# Patient Record
Sex: Female | Born: 1985 | Race: Black or African American | Hispanic: No | Smoking: Former smoker
Health system: Southern US, Community
[De-identification: ages and names within clinical notes are randomized; demographics above are authoritative.]

## PROBLEM LIST (undated history)

## (undated) DIAGNOSIS — G510 Bell's palsy: Secondary | ICD-10-CM

## (undated) HISTORY — PX: WISDOM TOOTH EXTRACTION: SHX21

## (undated) HISTORY — PX: TUBAL LIGATION: SHX77

## (undated) HISTORY — DX: Bell's palsy: G51.0

---
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
## 2008-02-06 ENCOUNTER — Inpatient Hospital Stay (HOSPITAL_COMMUNITY): Admission: AD | Admit: 2008-02-06 | Discharge: 2008-02-06 | Payer: Self-pay | Admitting: Obstetrics and Gynecology

## 2008-06-10 ENCOUNTER — Inpatient Hospital Stay (HOSPITAL_COMMUNITY): Admission: AD | Admit: 2008-06-10 | Discharge: 2008-06-11 | Payer: Self-pay | Admitting: Obstetrics and Gynecology

## 2008-07-11 ENCOUNTER — Inpatient Hospital Stay (HOSPITAL_COMMUNITY): Admission: AD | Admit: 2008-07-11 | Discharge: 2008-07-11 | Payer: Self-pay | Admitting: Obstetrics and Gynecology

## 2008-07-24 ENCOUNTER — Inpatient Hospital Stay (HOSPITAL_COMMUNITY): Admission: AD | Admit: 2008-07-24 | Discharge: 2008-07-28 | Payer: Self-pay | Admitting: Obstetrics and Gynecology

## 2008-07-25 ENCOUNTER — Encounter (INDEPENDENT_AMBULATORY_CARE_PROVIDER_SITE_OTHER): Payer: Self-pay | Admitting: Obstetrics and Gynecology

## 2010-07-20 ENCOUNTER — Encounter (INDEPENDENT_AMBULATORY_CARE_PROVIDER_SITE_OTHER): Payer: Self-pay | Admitting: Obstetrics and Gynecology

## 2010-07-20 ENCOUNTER — Inpatient Hospital Stay (HOSPITAL_COMMUNITY): Admission: RE | Admit: 2010-07-20 | Discharge: 2010-07-22 | Payer: Self-pay | Admitting: Obstetrics and Gynecology

## 2011-01-13 LAB — SURGICAL PCR SCREEN
MRSA, PCR: NEGATIVE
Staphylococcus aureus: POSITIVE — AB

## 2011-01-13 LAB — CBC
HCT: 30.2 % — ABNORMAL LOW (ref 36.0–46.0)
Hemoglobin: 10.5 g/dL — ABNORMAL LOW (ref 12.0–15.0)
MCH: 31.5 pg (ref 26.0–34.0)
MCHC: 34.7 g/dL (ref 30.0–36.0)
MCHC: 34.9 g/dL (ref 30.0–36.0)
MCV: 90.9 fL (ref 78.0–100.0)
MCV: 90.5 fL (ref 78.0–100.0)
Platelets: 202 10*3/uL (ref 150–400)
Platelets: 258 10*3/uL (ref 150–400)
RBC: 3.32 MIL/uL — ABNORMAL LOW (ref 3.87–5.11)
RDW: 13.8 % (ref 11.5–15.5)
RDW: 13.7 % (ref 11.5–15.5)
WBC: 9.9 10*3/uL (ref 4.0–10.5)
WBC: 8.9 10*3/uL (ref 4.0–10.5)

## 2011-01-13 LAB — URINALYSIS, ROUTINE W REFLEX MICROSCOPIC
Bilirubin Urine: NEGATIVE
Nitrite: NEGATIVE
Specific Gravity, Urine: 1.025 (ref 1.005–1.030)
pH: 6.5 (ref 5.0–8.0)

## 2011-01-13 LAB — RPR: RPR Ser Ql: NONREACTIVE

## 2011-01-13 LAB — URINE MICROSCOPIC-ADD ON

## 2011-01-15 ENCOUNTER — Inpatient Hospital Stay (INDEPENDENT_AMBULATORY_CARE_PROVIDER_SITE_OTHER)
Admission: RE | Admit: 2011-01-15 | Discharge: 2011-01-15 | Disposition: A | Discharge status: Home / Self Care | Payer: Commercial Managed Care - PPO | Source: Ambulatory Visit | Attending: Family Medicine | Admitting: Family Medicine

## 2011-01-15 DIAGNOSIS — J40 Bronchitis, not specified as acute or chronic: Secondary | ICD-10-CM
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
## 2011-03-15 NOTE — Op Note (Signed)
[name], [name] Reeves              ACCOUNT NO.:  [id number]   MEDICAL RECORD NO.:  [id number]          PATIENT TYPE:  INP   LOCATION:                                [facility]  [facility]   PHYSICIAN:  Janine Limbo, M.D.DATE OF BIRTH:  [birth year]   DATE OF PROCEDURE:  07/25/2008  DATE OF DISCHARGE:                               OPERATIVE REPORT   PREOPERATIVE DIAGNOSES:  1. Term intrauterine gestation.  2. Failure to progress and labor.  3. Nonreassuring fetal heart rate tracing.   POSTOPERATIVE DIAGNOSES:  1. Term intrauterine gestation.  2. Failure to progress and labor.  3. Nonreassuring fetal heart rate tracing.  4. Occiput transverse presentation.   PROCEDURE:  Primary low transverse cesarean section.   SURGEON:  Janine Limbo, MD   FIRST ASSISTANT:  Renaldo Reel. Emilee Hero, CNM   ANESTHETIC:  Epidural.   DISPOSITION:  [name] is a 25 year old female, gravida 2, para 0-0-1-  0, who presented at [redacted] weeks gestation on July 24, 2008.  The  patient has been followed at the [facility] division of  [facility].  The patient was started on Pitocin for  induction of labor on July 24, 2008.  She was also started on  penicillin antibiotics because of a positive beta Strep culture during  her third trimester.  The patient had her membranes ruptured at  approximately 1:41 a.m.  Light meconium fluid was noted.  The patient  dilated her cervix to 9 cm.  She was noted to have variable  decelerations, and an amnio infusion was begun.  However, after several  hours, her cervix was checked and was noted to be 6 cm, and she was  noted to have some swelling of her cervix.  The variable decelerations  continued.  Her Pitocin was discontinued, and a cesarean section was  recommended.  The indications, risks, and benefits of cesarean section  were reviewed with the patient, her husband, and her mother.  The  patient understood and accepted the risks  of, but not limited to,  anesthetic complications, bleeding, infections, and possible damage to  the surrounding organs.  All questions were answered.  They were ready  to proceed.   FINDINGS:  An 8 pound 4 ounce female infant Teofilo Pod).  The infant in a  left occiput transverse presentation.  The Apgar scores were 2 at 1  minute and 9 at 5 minutes.  The arterial cord blood pH was 7.29.  The  uterus, fallopian tubes, and the ovaries were normal for the gravid  state.   PROCEDURE:  The patient was taken to the operating room where it was  determined that the epidural she had received for labor would be  adequate for cesarean delivery.  The patient's abdomen was prepped with  multiple layers of Betadine and then sterilely draped.  A Foley catheter  had previously been placed.  The lower abdomen was injected with 15 mL  of 0.5% Marcaine with epinephrine.  A low transverse incision was made  in the abdomen and carried sharply through the subcutaneous tissue, the  fascia, and the anterior peritoneum.  An incision was made in the lower  uterine segment and extended in a low transverse fashion.  The fetal  head was delivered without difficulty.  The mouth and nose were  suctioned.  The remainder of the infant was delivered, and the infant  was handed to the awaiting pediatric team.  Routine cord blood studies  were obtained.  The placenta was removed and sent with the cord blood  registry team.  The placenta was then sent to pathology.  The uterine  cavity was cleaned of amniotic fluid, clotted blood, and membranes.  The  uterine incision was closed using a running locking suture of 2-0 Vicryl  followed by an imbricating suture of 2-0 Vicryl.  Hemostasis was  adequate.  The pelvis was vigorously irrigated.  The anterior peritoneum  was closed using a running suture of 2-0 Vicryl.  The fascia was closed  using a running suture of 0 Vicryl followed by three interrupted sutures  of 0 Vicryl.   The subcutaneous layer was irrigated.  The subcutaneous  layer was closed using a running suture of 0 Vicryl.  The skin was  reapproximated using a subcuticular suture of 3-0 Monocryl.  A pressure  dressing was applied.  Sponge, needle, and instrument counts were  correct on two occasions.  The estimated blood loss for the procedure  was 700 mL.  The patient tolerated her procedure well.  She was noted to  drain clear yellow urine at the end of her procedure.  The estimated  urine output was approximately 200 mL.  The patient was stable in the  recovery room.  The infant was taken to the full-term nursery in stable  condition.  The placenta was sent to pathology for evaluation.      Janine Limbo, M.D.  Electronically Signed     AVS/MEDQ  D:  07/25/2008  T:  07/26/2008  Job:  [id number]
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
## 2011-04-21 ENCOUNTER — Encounter: Payer: Self-pay | Admitting: Orthopedic Surgery

## 2011-04-21 ENCOUNTER — Ambulatory Visit (INDEPENDENT_AMBULATORY_CARE_PROVIDER_SITE_OTHER): Payer: Commercial Managed Care - PPO | Admitting: Orthopedic Surgery

## 2011-04-21 VITALS — HR 76 | Ht 64.0 in | Wt 176.0 lb

## 2011-04-21 DIAGNOSIS — M7750 Other enthesopathy of unspecified foot: Secondary | ICD-10-CM

## 2011-04-21 DIAGNOSIS — M222X9 Patellofemoral disorders, unspecified knee: Secondary | ICD-10-CM

## 2011-04-21 DIAGNOSIS — M25569 Pain in unspecified knee: Secondary | ICD-10-CM

## 2011-04-21 DIAGNOSIS — M659 Synovitis and tenosynovitis, unspecified: Secondary | ICD-10-CM

## 2011-04-21 MED ORDER — IBUPROFEN 800 MG PO TABS: 800.0000 mg | ORAL_TABLET | Freq: Three times a day (TID) | ORAL | Status: AC | PRN

## 2011-04-21 NOTE — Progress Notes (Signed)
   New patient, RIGHT ankle pain, RIGHT knee pain.  24 or a female reports throbbing, stabbing, burning, pain, which has become constant associated catching and swelling involving her RIGHT knee and RIGHT ankle.  RIGHT ankle hurts when she is up and walking as is the RIGHT knee. The RIGHT ankle pain is on the medial side. The RIGHT knee pain is on the medial side. Symptoms began she thinks after her during her pregnancy.  Previous treatments include ibuprofen.  Review of systems weight gain, fatigue, emergency joint pain and swelling. All of the systems were negative.  Vital signs are stable as recorded  General appearance is normal  The patient is alert and oriented x3  The patient's mood and affect are normal  The patient is ambulating with a normal gait pattern  The cardiovascular exam reveals normal pulses and temperature without edema swelling.  The lymphatic system is negative for palpable lymph nodes  The sensory exam is normal.  There are no pathologic reflexes.  Balance is normal.  Inspection and palpation revealed tenderness over the medial facet. Nontender. Joint lines. Range of motion normal. Patella mobility normal. Patella stability. Normal. Knee stability. Normal. Strength normal. Negative McMurray's. Skin normal.  RIGHT ankle  Flatfoot when standing, tenderness medially. Ankle range of motion normal ankle stability. Normal inversion strength weakness. Skin normal.  Upper extremities Upper extremity exam  Inspection and palpation revealed no abnormalities in the upper extremities.  Range of motion is full without contracture.  Motor exam is normal with grade 5 strength.  The joints are fully reduced without subluxation.  There is no atrophy or tremor and muscle tone is normal.  All joints are stable.

## 2011-04-21 NOTE — Progress Notes (Signed)
X-ray report.  RIGHT knee.  Normal. X-rays.  Impression normal x-ray

## 2011-04-21 NOTE — Patient Instructions (Signed)
Diagnosis #1 posterior tibial tendon disorder.  Diagnosis #2 anterior knee pain syndrome.  Treatment orthotics to control the tendon in her feet. Treatment knee exercise program for anterior knee pain syndrome.

## 2011-04-26 LAB — CBC AND DIFFERENTIAL: Hemoglobin: 12.5 g/dL (ref 12.0–16.0)

## 2011-04-26 LAB — LIPID PANEL
Cholesterol: 130 mg/dL (ref 0–200)
HDL: 51 mg/dL (ref 35–70)
LDL Cholesterol: 64 mg/dL

## 2011-04-26 LAB — HEPATIC FUNCTION PANEL
ALT: 19 U/L (ref 7–35)
AST: 21 U/L (ref 13–35)
Alkaline Phosphatase: 53 U/L (ref 25–125)

## 2011-04-27 ENCOUNTER — Ambulatory Visit (HOSPITAL_COMMUNITY)
Admission: RE | Admit: 2011-04-27 | Discharge: 2011-04-27 | Disposition: A | Discharge status: Home / Self Care | Payer: 59 | Source: Ambulatory Visit | Attending: Orthopedic Surgery | Admitting: Orthopedic Surgery

## 2011-04-27 DIAGNOSIS — M25579 Pain in unspecified ankle and joints of unspecified foot: Secondary | ICD-10-CM | POA: Insufficient documentation

## 2011-04-27 DIAGNOSIS — M6281 Muscle weakness (generalized): Secondary | ICD-10-CM | POA: Insufficient documentation

## 2011-04-27 DIAGNOSIS — IMO0001 Reserved for inherently not codable concepts without codable children: Secondary | ICD-10-CM | POA: Insufficient documentation

## 2011-04-27 DIAGNOSIS — M25569 Pain in unspecified knee: Secondary | ICD-10-CM | POA: Insufficient documentation

## 2011-04-27 DIAGNOSIS — R262 Difficulty in walking, not elsewhere classified: Secondary | ICD-10-CM | POA: Insufficient documentation

## 2011-05-02 ENCOUNTER — Ambulatory Visit (HOSPITAL_COMMUNITY)
Admission: RE | Admit: 2011-05-02 | Discharge: 2011-05-02 | Disposition: A | Discharge status: Home / Self Care | Payer: 59 | Source: Ambulatory Visit | Attending: Family Medicine | Admitting: Family Medicine

## 2011-05-02 DIAGNOSIS — M25569 Pain in unspecified knee: Secondary | ICD-10-CM | POA: Insufficient documentation

## 2011-05-02 DIAGNOSIS — R262 Difficulty in walking, not elsewhere classified: Secondary | ICD-10-CM | POA: Insufficient documentation

## 2011-05-02 DIAGNOSIS — M6281 Muscle weakness (generalized): Secondary | ICD-10-CM | POA: Insufficient documentation

## 2011-05-02 DIAGNOSIS — IMO0001 Reserved for inherently not codable concepts without codable children: Secondary | ICD-10-CM | POA: Insufficient documentation

## 2011-05-02 DIAGNOSIS — M25579 Pain in unspecified ankle and joints of unspecified foot: Secondary | ICD-10-CM | POA: Insufficient documentation

## 2011-05-06 ENCOUNTER — Ambulatory Visit (HOSPITAL_COMMUNITY): Payer: 59

## 2011-05-06 ENCOUNTER — Ambulatory Visit (HOSPITAL_COMMUNITY): Admission: RE | Admit: 2011-05-06 | Payer: 59 | Source: Ambulatory Visit

## 2011-05-10 ENCOUNTER — Ambulatory Visit (HOSPITAL_COMMUNITY)
Admission: RE | Admit: 2011-05-10 | Discharge: 2011-05-10 | Disposition: A | Discharge status: Home / Self Care | Payer: 59 | Source: Ambulatory Visit | Attending: Family Medicine | Admitting: Family Medicine

## 2011-05-10 NOTE — Progress Notes (Signed)
Physical Therapy Treatment Patient Name: [name] [id number] Date: 05/10/2011  Visit #: 3/4  Time In: 8:08 Time Out: 8:40    Subjective: "I'm really not in pain today!" 0/10 pain.       Exercise/Treatments @FLOW ([id number])@  Goals PT Short Term Goals Short Term Goal 1: Inedpendent with HEP Long Term Goal 1 Progress: Progressing toward goal Short Term Goal 2: Able to work 4 hrs without pain Long Term Goal 2 Progress: Progressing toward goal PT Long Term Goals Long Term Goal 1: Able to ascend/descend stairs without pain. Long Term Goal 1 Progress: Progressing toward goal End of Session There is no problem list on file for this patient.  PT - End of Session Activity Tolerance: Patient tolerated treatment well  Assessment: Pt completes theres without difficulty. Pt displays ankle instability with CW/CCW BAPS ex.  Plan: Continue per PT POC.   Seth Bake [facility] 05/10/2011, 8:46 AM

## 2011-05-12 ENCOUNTER — Ambulatory Visit (HOSPITAL_COMMUNITY)
Admission: RE | Admit: 2011-05-12 | Discharge: 2011-05-12 | Disposition: A | Discharge status: Home / Self Care | Payer: 59 | Source: Ambulatory Visit | Attending: Family Medicine | Admitting: Family Medicine

## 2011-05-12 NOTE — Progress Notes (Signed)
Physical Therapy Treatment Patient Name: [name] [id number] Date: 05/12/2011  Visit #: 4/5  Time In: 8:55  Time Out: 9:28  Subjective: Hurting because I just got up and go moving. 3/10 pain R ankle.    Exercise/Treatments See doc ankle and knee ex in doc flowsheets.    Goals PT Short Term Goals Long Term Goal 1 Progress: Met Long Term Goal 2 Progress: Progressing toward goal PT Long Term Goals Long Term Goal 1 Progress: Progressing toward goal End of Session There is no problem list on file for this patient.  PT - End of Session Activity Tolerance: Patient tolerated treatment well PT Assessment and Plan Clinical Impression Statement: Pt completes therex without difficulty. Pt displays increased anlke and knee stabilty with therex. PT Frequency: Min 3X/week PT Duration: 6 weeks PT Treatment/Interventions: Therapeutic exercise;Balance training PT Plan: Continue per PT POC.   [name] [facility] 05/12/2011, 9:36 AM

## 2011-05-16 ENCOUNTER — Inpatient Hospital Stay (HOSPITAL_COMMUNITY): Admission: RE | Admit: 2011-05-16 | Payer: 59 | Source: Ambulatory Visit | Admitting: Physical Therapy

## 2011-05-20 ENCOUNTER — Inpatient Hospital Stay (HOSPITAL_COMMUNITY): Admission: RE | Admit: 2011-05-20 | Payer: 59 | Source: Ambulatory Visit | Admitting: Physical Therapy

## 2011-05-20 ENCOUNTER — Telehealth (HOSPITAL_COMMUNITY): Payer: Self-pay | Admitting: Physical Therapy

## 2011-07-26 LAB — COMPREHENSIVE METABOLIC PANEL
ALT: 37 — ABNORMAL HIGH
AST: 30
Calcium: 8.9
GFR calc Af Amer: >60
Sodium: 136
Total Protein: 7

## 2011-07-26 LAB — URINALYSIS, ROUTINE W REFLEX MICROSCOPIC
Ketones, ur: 15 — AB
Nitrite: NEGATIVE
Specific Gravity, Urine: 1.02
Urobilinogen, UA: 0.2

## 2011-07-26 LAB — DIFFERENTIAL
Eosinophils Absolute: 0.1
Eosinophils Relative: 2
Lymphs Abs: 1.3
Monocytes Relative: 8

## 2011-07-26 LAB — CBC
MCHC: 34.4
Platelets: 279
RDW: 13.6

## 2011-07-29 LAB — WET PREP, GENITAL
Clue Cells Wet Prep HPF POC: NONE SEEN
Trich, Wet Prep: NONE SEEN
Yeast Wet Prep HPF POC: NONE SEEN

## 2011-07-29 LAB — STREP B DNA PROBE

## 2011-07-29 LAB — GC/CHLAMYDIA PROBE AMP, GENITAL: GC Probe Amp, Genital: NEGATIVE

## 2011-08-01 LAB — CBC
HCT: 25 — ABNORMAL LOW
HCT: 33.3 — ABNORMAL LOW
MCHC: 33.6
MCHC: 33.7
MCV: 89.2
MCV: 89.3
Platelets: 213
RBC: 3.73 — ABNORMAL LOW
RDW: 14.5
WBC: 13.3 — ABNORMAL HIGH

## 2011-08-01 LAB — DIFFERENTIAL
Basophils Absolute: 0
Basophils Relative: 0
Eosinophils Absolute: 0.7
Eosinophils Relative: 4
Lymphocytes Relative: 6 — ABNORMAL LOW
Lymphs Abs: 1.1
Monocytes Absolute: 1.4 — ABNORMAL HIGH
Monocytes Relative: 8
Neutro Abs: 15 — ABNORMAL HIGH
Neutrophils Relative %: 82 — ABNORMAL HIGH

## 2011-08-01 LAB — SYPHILIS: RPR W/REFLEX TO RPR TITER AND TREPONEMAL ANTIBODIES, TRADITIONAL SCREENING AND DIAGNOSIS ALGORITHM: RPR Ser Ql: NONREACTIVE

## 2011-11-01 HISTORY — PX: BILATERAL SALPINGECTOMY: SHX5743

## 2012-01-06 ENCOUNTER — Ambulatory Visit (INDEPENDENT_AMBULATORY_CARE_PROVIDER_SITE_OTHER): Payer: 59 | Admitting: Family Medicine

## 2012-01-06 ENCOUNTER — Encounter: Payer: Self-pay | Admitting: Family Medicine

## 2012-01-06 VITALS — BP 110/70 | HR 79 | Resp 16 | Ht 63.5 in | Wt 174.1 lb

## 2012-01-06 DIAGNOSIS — E669 Obesity, unspecified: Secondary | ICD-10-CM

## 2012-01-06 DIAGNOSIS — Z0000 Encounter for general adult medical examination without abnormal findings: Secondary | ICD-10-CM

## 2012-01-06 DIAGNOSIS — Z23 Encounter for immunization: Secondary | ICD-10-CM
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
## 2012-01-06 NOTE — Patient Instructions (Signed)
I recommend a multivitamin daily I recommend yearly eye visit Dental visit every 6 months I recommend 30 minutes of exercise 5 days a week Watch the soda, sweet tea, simple carbs (bread, pasta), add fresh fruit and veggies Please schedule a PAP Smear with your GYN  I will get records from [facility]  Follow-up as needed

## 2012-01-16 ENCOUNTER — Encounter: Payer: Self-pay | Admitting: Family Medicine

## 2012-01-19 ENCOUNTER — Other Ambulatory Visit: Payer: Self-pay | Admitting: Family Medicine

## 2012-01-19 MED ORDER — FEXOFENADINE HCL 180 MG PO TABS: 180.0000 mg | ORAL_TABLET | Freq: Every day | ORAL | Status: DC

## 2012-01-19 NOTE — Progress Notes (Signed)
Pt complains of seasonal allergies which she has had in the past, asking for allegra Will send in to [facility]

## 2012-01-24 ENCOUNTER — Ambulatory Visit (INDEPENDENT_AMBULATORY_CARE_PROVIDER_SITE_OTHER): Payer: 59 | Admitting: Family Medicine

## 2012-01-24 ENCOUNTER — Encounter: Payer: Self-pay | Admitting: Family Medicine

## 2012-01-24 VITALS — BP 118/75 | HR 76 | Resp 18 | Wt 174.0 lb

## 2012-01-24 DIAGNOSIS — Z3201 Encounter for pregnancy test, result positive: Secondary | ICD-10-CM

## 2012-01-24 DIAGNOSIS — Z9851 Tubal ligation status: Secondary | ICD-10-CM

## 2012-01-24 NOTE — Assessment & Plan Note (Addendum)
Pt has positive pregnancy test in setting of bilateral tubal ligation. Will obtain Quant to verify pregnancy, ultrasound will be obtained if indeed positive and referral to GYN Given red flags, currently exam not concerning.  Quant level is positive at 55, will send for ultrasound in the AM

## 2012-01-24 NOTE — Progress Notes (Signed)
  Subjective:    Patient ID: [name], female    DOB: 10-29-1986, 26 y.o.   MRN: [id number]  HPI Pt presents with abnormal menses. She is s/p BTL over 1 year ago after her last child. She had a normal menses 16 days ago, but began to have mild cramping and brown discharge this AM. Urine pregnancy test was done and is positive x 2.    Review of Systems  GEN- denies fatigue, fever, weight loss,weakness, recent illness HEENT- denies eye drainage, change in vision, nasal discharge, CVS- denies chest pain, palpitations RESP- denies SOB, cough, wheeze ABD- denies N/V, change in stools,+mild abd pain GU- denies dysuria, hematuria, dribbling, incontinence MSK- denies joint pain, muscle aches, injury Neuro- denies headache, dizziness, syncope, seizure activity      Objective:   Physical Exam GEN- NAD, alert and oriented x3 RESP-normal WOB ABD-NABS,soft, mild TTP LLQ, no suprapubic tenderness, no rebound, no guarding, no mass felt EXT- No edema Pulses- Radial, DP- 2+        Assessment & Plan:

## 2012-01-25 ENCOUNTER — Telehealth: Payer: Self-pay | Admitting: Family Medicine

## 2012-01-25 ENCOUNTER — Ambulatory Visit (HOSPITAL_COMMUNITY)
Admission: RE | Admit: 2012-01-25 | Discharge: 2012-01-25 | Disposition: A | Discharge status: Home / Self Care | Payer: 59 | Source: Ambulatory Visit | Attending: Family Medicine | Admitting: Family Medicine

## 2012-01-25 ENCOUNTER — Other Ambulatory Visit: Payer: Self-pay | Admitting: Family Medicine

## 2012-01-25 DIAGNOSIS — Z3201 Encounter for pregnancy test, result positive: Secondary | ICD-10-CM | POA: Insufficient documentation

## 2012-01-25 DIAGNOSIS — Z9851 Tubal ligation status: Secondary | ICD-10-CM | POA: Insufficient documentation

## 2012-01-25 NOTE — Telephone Encounter (Signed)
I spoke with pt given results, she will need to be seen by GYN . I discussed her case with Dr.Ferguson at [facility] and he agreed to see pt this afternoon.

## 2012-01-26 ENCOUNTER — Other Ambulatory Visit: Payer: Self-pay | Admitting: Family Medicine

## 2012-01-26 NOTE — Progress Notes (Signed)
Patient seen by GYN. He will she will require repeat beta hCG every 48 hours. Labs will be placed for her to have Beta hCG today, Saturday March 30th  and Monday April 1st

## 2012-01-30 ENCOUNTER — Other Ambulatory Visit: Payer: Self-pay | Admitting: Family Medicine

## 2012-01-30 LAB — HCG, QUANTITATIVE, PREGNANCY: hCG, Beta Chain, Quant, S: 3.01 m[IU]/mL

## 2012-02-08 ENCOUNTER — Other Ambulatory Visit (HOSPITAL_COMMUNITY)
Admission: RE | Admit: 2012-02-08 | Discharge: 2012-02-08 | Disposition: A | Discharge status: Home / Self Care | Payer: 59 | Source: Ambulatory Visit | Attending: Obstetrics and Gynecology | Admitting: Obstetrics and Gynecology

## 2012-02-08 ENCOUNTER — Other Ambulatory Visit: Payer: Self-pay | Admitting: Obstetrics and Gynecology

## 2012-02-08 DIAGNOSIS — Z01419 Encounter for gynecological examination (general) (routine) without abnormal findings: Secondary | ICD-10-CM | POA: Insufficient documentation

## 2012-02-10 ENCOUNTER — Other Ambulatory Visit: Payer: Self-pay | Admitting: Obstetrics and Gynecology

## 2012-02-10 ENCOUNTER — Encounter (HOSPITAL_COMMUNITY): Payer: Self-pay

## 2012-02-10 ENCOUNTER — Encounter (HOSPITAL_COMMUNITY)
Admission: RE | Admit: 2012-02-10 | Discharge: 2012-02-10 | Disposition: A | Discharge status: Home / Self Care | Payer: 59 | Source: Ambulatory Visit | Attending: Obstetrics and Gynecology | Admitting: Obstetrics and Gynecology

## 2012-02-10 LAB — CBC
MCHC: 32.8 g/dL (ref 30.0–36.0)
Platelets: 294 10*3/uL (ref 150–400)
RDW: 13.5 % (ref 11.5–15.5)
WBC: 5.6 10*3/uL (ref 4.0–10.5)

## 2012-02-10 LAB — SURGICAL PCR SCREEN: Staphylococcus aureus: POSITIVE — AB

## 2012-02-10 LAB — URINALYSIS, ROUTINE W REFLEX MICROSCOPIC
Bilirubin Urine: NEGATIVE
Glucose, UA: NEGATIVE mg/dL
Ketones, ur: NEGATIVE mg/dL
Leukocytes, UA: NEGATIVE
Nitrite: NEGATIVE
Protein, ur: NEGATIVE mg/dL

## 2012-02-10 LAB — RPR: RPR Ser Ql: NONREACTIVE

## 2012-02-10 NOTE — Patient Instructions (Signed)
20 [name]  02/10/2012   Your procedure is scheduled on:  02/17/2012  Report to [facility] at  615  AM.  Call this number if you have problems the morning of surgery: [phone]   Remember:   Do not eat food:After Midnight.  May have clear liquids:until Midnight .  Clear liquids include soda, tea, black coffee, apple or grape juice, broth.  Take these medicines the morning of surgery with A SIP OF WATER: none   Do not wear jewelry, make-up or nail polish.  Do not wear lotions, powders, or perfumes. You may wear deodorant.  Do not shave 48 hours prior to surgery.  Do not bring valuables to the hospital.  Contacts, dentures or bridgework may not be worn into surgery.  Leave suitcase in the car. After surgery it may be brought to your room.  For patients admitted to the hospital, checkout time is 11:00 AM the day of discharge.   Patients discharged the day of surgery will not be allowed to drive home.  Name and phone number of your driver: family  Special Instructions: CHG Shower Use Special Wash: 1/2 bottle night before surgery and 1/2 bottle morning of surgery.   Please read over the following fact sheets that you were given: Pain Booklet, MRSA Information, Surgical Site Infection Prevention, Anesthesia Post-op Instructions and Care and Recovery After Surgery Endometrial Ablation Endometrial ablation removes the lining of the uterus (endometrium). It is usually a same day, outpatient treatment. Ablation helps avoid major surgery (such as a hysterectomy). A hysterectomy is removal of the cervix and uterus. Endometrial ablation has less risk and complications, has a shorter recovery period and is less expensive. After endometrial ablation, most women will have little or no menstrual bleeding. You may not keep your fertility. Pregnancy is no longer likely after this procedure but if you are pre-menopausal, you still need to use a reliable method of birth control following the procedure  because pregnancy can occur. REASONS TO HAVE THE PROCEDURE MAY INCLUDE:  Heavy periods.   Bleeding that is causing anemia.   Anovulatory bleeding, very irregular, bleeding.   Bleeding submucous fibroids (on the lining inside the uterus) if they are smaller than 3 centimeters.  REASONS NOT TO HAVE THE PROCEDURE MAY INCLUDE:  You wish to have more children.   You have a pre-cancerous or cancerous problem. The cause of any abnormal bleeding must be diagnosed before having the procedure.   You have pain coming from the uterus.   You have a submucus fibroid larger than 3 centimeters.   You recently had a baby.   You recently had an infection in the uterus.   You have a severe retro-flexed, tipped uterus and cannot insert the instrument to do the ablation.   You had a Cesarean section or deep major surgery on the uterus.   The inner cavity of the uterus is too large for the endometrial ablation instrument.  RISKS AND COMPLICATIONS   Perforation of the uterus.   Bleeding.   Infection of the uterus, bladder or vagina.   Injury to surrounding organs.   Cutting the cervix.   An air bubble to the lung (air embolus).   Pregnancy following the procedure.   Failure of the procedure to help the problem requiring hysterectomy.   Decreased ability to diagnose cancer in the lining of the uterus.  BEFORE THE PROCEDURE  The lining of the uterus must be tested to make sure there is no pre-cancerous or cancer  cells present.   Medications may be given to make the lining of the uterus thinner.   Ultrasound may be used to evaluate the size and look for abnormalities of the uterus.   Future pregnancy is not desired.  PROCEDURE  There are different ways to destroy the lining of the uterus.   Resectoscope - radio frequency-alternating electric current is the most common one used.   Cryotherapy - freezing the lining of the uterus.   Heated Free Liquid - heated salt (saline)  solution inserted into the uterus.   Microwave - uses high energy microwaves in the uterus.   Thermal Balloon - a catheter with a balloon tip is inserted into the uterus and filled with heated fluid.  Your caregiver will talk with you about the method used in this clinic. They will also instruct you on the pros and cons of the procedure. Endometrial ablation is performed along with a procedure called operative hysteroscopy. A narrow viewing tube is inserted through the birth canal (vagina) and through the cervix into the uterus. A tiny camera attached to the viewing tube (hysteroscope) allows the uterine cavity to be shown on a TV monitor during surgery. Your uterus is filled with a harmless liquid to make the procedure easier. The lining of the uterus is then removed. The lining can also be removed with a resectoscope which allows your surgeon to cut away the lining of the uterus under direct vision. Usually, you will be able to go home within an hour after the procedure. HOME CARE INSTRUCTIONS   Do not drive for 24 hours.   No tampons, douching or intercourse for 2 weeks or until your caregiver approves.   Rest at home for 24 to 48 hours. You may then resume normal activities unless told differently by your caregiver.   Take your temperature two times a day for 4 days, and record it.   Take any medications your caregiver has ordered, as directed.   Use some form of contraception if you are pre-menopausal and do not want to get pregnant.  Bleeding after the procedure is normal. It varies from light spotting and mildly watery to bloody discharge for 4 to 6 weeks. You may also have mild cramping. Only take over-the-counter or prescription medicines for pain, discomfort, or fever as directed by your caregiver. Do not use aspirin, as this may aggravate bleeding. Frequent urination during the first 24 hours is normal. You will not know how effective your surgery is until at least 3 months after the  surgery. SEEK IMMEDIATE MEDICAL CARE IF:   Bleeding is heavier than a normal menstrual cycle.   An oral temperature above 102 F (38.9 C) develops.   You have increasing cramps or pains not relieved with medication or develop belly (abdominal) pain which does not seem to be related to the same area of earlier cramping and pain.   You are light headed, weak or have fainting episodes.   You develop pain in the shoulder strap areas.   You have chest or leg pain.   You have abnormal vaginal discharge.   You have painful urination.  Document Released: 08/26/2004 Document Revised: 10/06/2011 Document Reviewed: 11/24/2007 [facility] Patient Information 2012 [place], [place].Hysteroscopy Hysteroscopy is a procedure used for looking inside the womb (uterus). It may be done for many different reasons, including:  To evaluate abnormal bleeding, fibroid (benign, noncancerous) tumors, polyps, scar tissue (adhesions), and possibly cancer of the uterus.   To look for lumps (tumors) and other  uterine growths.   To look for causes of why a woman cannot get pregnant (infertility), causes of recurrent loss of pregnancy (miscarriages), or a lost intrauterine device (IUD).   To perform a sterilization by blocking the fallopian tubes from inside the uterus.  A hysteroscopy should be done right after a menstrual period to be sure you are not pregnant. LET YOUR CAREGIVER KNOW ABOUT:   Allergies.   Medicines taken, including herbs, eyedrops, over-the-counter medicines, and creams.   Use of steroids (by mouth or creams).   Previous problems with anesthetics or numbing medicines.   History of bleeding or blood problems.   History of blood clots.   Possibility of pregnancy, if this applies.   Previous surgery.   Other health problems.  RISKS AND COMPLICATIONS   Putting a hole in the uterus.   Excessive bleeding.   Infection.   Damage to the cervix.   Injury to other organs.   Allergic  reaction to medicines.   Too much fluid used in the uterus for the procedure.  BEFORE THE PROCEDURE   Do not take aspirin or blood thinners for a week before the procedure, or as directed. It can cause bleeding.   Arrive at least 60 minutes before the procedure or as directed to read and sign the necessary forms.   Arrange for someone to take you home after the procedure.   If you smoke, do not smoke for 2 weeks before the procedure.  PROCEDURE   Your caregiver may give you medicine to relax you. He or she may also give you a medicine that numbs the area around the cervix (local anesthetic) or a medicine that makes you sleep (general anesthesia).   Sometimes, a medicine is placed in the cervix the day before the procedure. This medicine makes the cervix have a larger opening (dilate). This makes it easier for the instrument to be inserted into the uterus.   A small instrument (hysteroscope) is inserted through the vagina into the uterus. This instrument is similar to a pencil-sized telescope with a light.   During the procedure, air or a liquid is put into the uterus, which allows the surgeon to see better.   Sometimes, tissue is gently scraped from inside the uterus. These tissue samples are sent to a specialist who looks at tissue samples (pathologist). The pathologist will give a report to your caregiver. This will help your caregiver decide if further treatment is necessary. The report will also help your caregiver decide on the best treatment if the test comes back abnormal.  AFTER THE PROCEDURE   If you had a general anesthetic, you may be groggy for a couple hours after the procedure.   If you had a local anesthetic, you will be advised to rest at the surgical center or caregiver's office until you are stable and feel ready to go home.   You may have some cramping for a couple days.   You may have bleeding, which varies from light spotting for a few days to menstrual-like  bleeding for up to 3 to 7 days. This is normal.   Have someone take you home.  FINDING OUT THE RESULTS OF YOUR TEST Not all test results are available during your visit. If your test results are not back during the visit, make an appointment with your caregiver to find out the results. Do not assume everything is normal if you have not heard from your caregiver or the medical facility. It is important for  you to follow up on all of your test results. HOME CARE INSTRUCTIONS   Do not drive for 24 hours or as instructed.   Only take over-the-counter or prescription medicines for pain, discomfort, or fever as directed by your caregiver.   Do not take aspirin. It can cause or aggravate bleeding.   Do not drive or drink alcohol while taking pain medicine.   You may resume your usual diet.   Do not use tampons, douche, or have sexual intercourse for 2 weeks, or as advised by your caregiver.   Rest and sleep for the first 24 to 48 hours.   Take your temperature twice a day for 4 to 5 days. Write it down. Give these temperatures to your caregiver if they are abnormal (above 98.6 F or 37.0 C).   Take medicines your caregiver has ordered as directed.   Follow your caregiver's advice regarding diet, exercise, lifting, driving, and general activities.   Take showers instead of baths for 2 weeks, or as recommended by your caregiver.   If you develop constipation:   Take a mild laxative with the advice of your caregiver.   Eat bran foods.   Drink enough water and fluids to keep your urine clear or pale yellow.   Try to have someone with you or available to you for the first 24 to 48 hours, especially if you had a general anesthetic.   Make sure you and your family understand everything about your operation and recovery.   Follow your caregiver's advice regarding follow-up appointments and Pap smears.  SEEK MEDICAL CARE IF:   You feel dizzy or lightheaded.   You feel sick to your  stomach (nauseous).   You develop abnormal vaginal discharge.   You develop a rash.   You have an abnormal reaction or allergy to your medicine.   You need stronger pain medicine.  SEEK IMMEDIATE MEDICAL CARE IF:   Bleeding is heavier than a normal menstrual period or you have blood clots.   You have an oral temperature above 102 F (38.9 C), not controlled by medicine.   You have increasing cramps or pains not relieved with medicine.   You develop belly (abdominal) pain that does not seem to be related to the same area of earlier cramping and pain.   You pass out.   You develop pain in the tops of your shoulders (shoulder strap areas).   You develop shortness of breath.  MAKE SURE YOU:   Understand these instructions.   Will watch your condition.   Will get help right away if you are not doing well or get worse.  Document Released: 01/23/2001 Document Revised: 10/06/2011 Document Reviewed: 05/18/2009 [facility] Patient Information 2012 [place], [place].Dilation and Curettage or Vacuum Curettage Dilation and curettage (D&C) and vacuum curettage are minor procedures. A D&C involves stretching (dilation) the cervix and scraping (curettage) the inside lining of the womb (uterus). During a D&C, tissue is gently scraped from the inside lining of the uterus. During a vacuum curettage, the lining and tissue in the uterus are removed with the use of gentle suction. Curettage may be performed for diagnostic or therapeutic purposes. As a diagnostic procedure, curettage is performed for the purpose of examining tissues from the uterus. Tissue examination may help determine causes or treatment options for symptoms. A diagnostic curettage may be performed for the following symptoms:  Irregular bleeding in the uterus.   Bleeding with the development of clots.   Spotting between menstrual periods.  Prolonged menstrual periods.   Bleeding after menopause.   No menstrual period  (amenorrhea).   A change in size and shape of the uterus.  A therapeutic curettage is performed to remove tissue, blood, or a contraceptive device. Therapeutic curettage may be performed for the following conditions:   Removal of an IUD (intrauterine device).   Removal of retained placenta after giving birth. Retained placenta can cause bleeding severe enough to require transfusions or an infection.   Abortion.   Miscarriage.   Removal of polyps inside the uterus.   Removal of uncommon types of fibroids (noncancerous lumps).  LET YOUR CAREGIVER KNOW ABOUT:   Allergies to food or medicine.   Medicines taken, including vitamins, herbs, eyedrops, over-the-counter medicines, and creams.   Use of steroids (by mouth or creams).   Previous problems with anesthetics or numbing medicines.   History of bleeding problems or blood clots.   Previous surgery.   Other health problems, including diabetes and kidney problems.   Possibility of pregnancy, if this applies.  RISKS AND COMPLICATIONS   Excessive bleeding.   Infection of the uterus.   Damage to the cervix.   Development of scar tissue (adhesions) inside the uterus, later causing abnormal amounts of menstrual bleeding.   Complications from the general anesthetic, if a general anesthetic is used.   Putting a hole (perforation) in the uterus. This is rare.  BEFORE THE PROCEDURE   Eat and drink before the procedure only as directed by your caregiver.   Arrange for someone to take you home.  PROCEDURE   This procedure may be done in a hospital, outpatient clinic, or caregiver's office.   You may be given a general anesthetic or a local anesthetic in and around the cervix.   You will lie on your back with your legs in stirrups.   There are two ways in which your cervix can be softened and dilated. These include:   Taking a medicine.   Having thin rods (laminaria) inserted into your cervix.   A curved tool (curette)  will scrape cells from the inside lining of the uterus and will then be removed.  This procedure usually takes about 15 to 30 minutes. AFTER THE PROCEDURE   You will rest in the recovery area until you are stable and are ready to go home.   You will need to have someone take you home.   You may feel sick to your stomach (nauseous) or throw up (vomit) if you had general anesthesia.   You may have a sore throat if a tube was placed in your throat during general anesthesia.   You may have light cramping and bleeding for 2 days to 2 weeks after the procedure.   Your uterus needs to make a new lining after the procedure. This may make your next period late.  Document Released: 10/17/2005 Document Revised: 10/06/2011 Document Reviewed: 05/15/2009 [facility] Patient Information 2012 [place], [place].Bilateral Salpingo-Oophorectomy Removal of both fallopian tubes and ovaries is called a Bilateral Salpingo-oophorectomy (BSO). The fallopian tubes transport the egg from the ovary to the womb (uterus). The fallopian tube is also where the sperm and egg meet and become fertilized and move down into the uterus. Usually when a BSO is done, the uterus was previously removed. Removing both tubes and ovaries will:  Put you into the menopause. You will no longer have menstrual periods.   May cause you to have symptoms of menopause (hot flashes, night sweats, mood changes).   Not affect  your sex drive or physical relationship.   Cause you to not be able to become pregnant (sterile).  LET YOUR CAREGIVER KNOW ABOUT:  Allergies to food or medication.   Medications taken including herbs, eye drops, over-the-counter medications, and creams.   Use of steroids (by mouth or creams).   Previous problems with anesthetics or numbing medication.   Possibility of pregnancy, if this applies.   Your smoking habits   History of blood clots (thrombophlebitis).   History of bleeding or blood problems.   Previous  surgeries.   Other health problems.  RISKS AND COMPLICATIONS All surgery is associated with risks. Some of these risks are:  Injury to surrounding organs.   Bleeding.   Infection.   Blood clots in the legs or lungs.   Problems with the anesthesia.   The surgery does not help the problem.   Death.  BEFORE THE PROCEDURE  Do not take aspirin or blood thinners because it can make you bleed.   Do not eat or drink anything at least 8 hours before the surgery.   Let your caregiver know if you develop a cold or an infection.   If you are being admitted the day of surgery, arrive at least 1 hour before the surgery to read and sign the necessary forms and consents.   Arrange for help when you go home from the hospital.   If you smoke, do not smoke for at least 2 weeks before the surgery.  PROCEDURE  You will change into a hospital gown. Then, you will be given an IV (intravenous) and a medication to relax you. You will be put to sleep with an anesthetic. Any hair on your lower belly (abdomen) will be removed, and a catheter will be placed in your bladder. The fallopian tubes and ovaries will be removed either through 2 very small cuts (incisions) or through large incision in the lower abdomen. AFTER THE PROCEDURE  You will be taken to the recovery room for 1 to 3 hours until your blood pressure, pulse, and temperature are stable and you are waking up.   If you had a laparoscopy, you may be discharged in several hours.   If you had a laparoscopy, you may have shoulder pain for a day or two from air left in the abdomen. The air can irritate the nerve that goes from the diaphragm to the shoulder.   You will be given pain medication as is necessary.   The intravenous and catheter will be removed.   Have someone available to take you home from the hospital.  HOME CARE INSTRUCTIONS   Only take over-the-counter or prescription medicines for pain, discomfort, or fever as directed by  your caregiver.   Do not take aspirin. It can cause bleeding.   Do not drive when taking pain medication.   Follow your caregiver's advice regarding diet, exercise, lifting, driving, and general activities.   You may resume your usual diet as directed and allowed.   Get plenty of rest and sleep.   Do not douche, use tampons, or have sexual intercourse until your caregiver says it is okay.   Change your bandages (dressings) as directed.   Take your temperature twice a day and write it down.   Your caregiver may recommend showers instead of baths for a few weeks.   Do not drink alcohol until your caregiver says it is okay.   If you develop constipation, you may take a mild laxative with your caregiver's permission.  Bran foods and drinking fluids helps with constipation problems.   Try to have someone home with you for a week or two to help with the household activities.   Make sure you and your family understands everything about your operation and recovery.   Do not sign any legal documents until you feel normal again.   Keep all your follow-up appointments.  SEEK MEDICAL CARE IF:   There is swelling, redness, or increasing pain in the wound area.   Pus is coming from the wound.   You notice a bad smell from the wound or surgical dressing.   You have pain, redness, or swelling from the intravenous site.   The wound is breaking open (the edges are not staying together).   You feel dizzy or feel like fainting.   You develop pain or bleeding when you urinate.   You develop diarrhea.   You develop nausea and vomiting.   You develop abnormal vaginal discharge.   You develop a rash.   You have any type of abnormal reaction or develop an allergy to your medication.   You need stronger pain medication for your pain.  SEEK IMMEDIATE MEDICAL CARE IF:   You develop an unexplained temperature above 100 F (37.8 C).   You develop abdominal pain.   You develop chest  pain.   You develop shortness of breath.   You pass out.   You develop pain, swelling, or redness of your leg.   You develop heavy vaginal bleeding with or without blood clots.  Document Released: 10/17/2005 Document Revised: 10/06/2011 Document Reviewed: 03/13/2009 [facility] Patient Information 2012 [place], [place].PATIENT INSTRUCTIONS POST-ANESTHESIA  IMMEDIATELY FOLLOWING SURGERY:  Do not drive or operate machinery for the first twenty four hours after surgery.  Do not make any important decisions for twenty four hours after surgery or while taking narcotic pain medications or sedatives.  If you develop intractable nausea and vomiting or a severe headache please notify your doctor immediately.  FOLLOW-UP:  Please make an appointment with your surgeon as instructed. You do not need to follow up with anesthesia unless specifically instructed to do so.  WOUND CARE INSTRUCTIONS (if applicable):  Keep a dry clean dressing on the anesthesia/puncture wound site if there is drainage.  Once the wound has quit draining you may leave it open to air.  Generally you should leave the bandage intact for twenty four hours unless there is drainage.  If the epidural site drains for more than 36-48 hours please call the anesthesia department.  QUESTIONS?:  Please feel free to call your physician or the hospital operator if you have any questions, and they will be happy to assist you.     [facility] Anesthesia Department [address] [place] [phone]

## 2012-02-14 ENCOUNTER — Encounter: Payer: Self-pay | Admitting: Family Medicine

## 2012-02-15 ENCOUNTER — Telehealth: Payer: Self-pay | Admitting: Family Medicine

## 2012-02-15 MED ORDER — ALPRAZOLAM 0.25 MG PO TABS: ORAL_TABLET | ORAL | Status: DC

## 2012-02-15 NOTE — Telephone Encounter (Signed)
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 Feels overwhelmed and like she may snap. Husband is providing support Will send low dose xanax, as she is still working for nerves, situational stress.

## 2012-02-16 NOTE — H&P (Signed)
[name] is an 26 y.o. female. She is admitted for laparoscopic bilateral salpingectomy due to failed tubal ligation with recent ectopic pregnancy. Additionally due to significant heavy periods she desires endometrial ablation to be performed at the same time . In addition there is a small tag on the anterior neck which we will take off while she is asleep.  She is a gravida 3 para 2012 whose positive pregnancy test was late March area LMP was March 26 only light. Serial quantitative hCG levels identified positive pregnancy test which she finally had spontaneous resolution of her pregnancy, with serum level  hCG 3 on April 1. She desires illumination and potential for future pregnancies, and salpingectomy bilaterally his plan of treatment options discussed. Past medical history benign Past surgical history: Cesarean section 2009, 2011 tubal ligation 2011  Pertinent Gynecological History: Menses: flow is excessive with use of Several pads or tampons on heaviest days Bleeding: Regular Contraception: tubal ligation and Failed tubal DES exposure: unknown Blood transfusions: none Sexually transmitted diseases: no past history Previous GYN Procedures: C-section x2 and tubal ligation  Last mammogram: Not applicable Date:  Last pap: normal Date: 2012 OB History: G3, P 2012   Menstrual History: Menarche age:  No LMP recorded. 01/24/2012    Past Medical History  Diagnosis Date  . Bell's palsy   . Bell's palsy     Past Surgical History  Procedure Date  . Cesarean section x 2  . Tubal ligation   . Wisdom tooth extraction   . Tonsillectomy 1991    Family History  Problem Relation Age of Onset  . Heart disease    . Arthritis    . Lung disease    . Cancer    . Asthma    . Depression Mother   . Hypertension Mother   . Asthma Mother   . Hypertension Father   . Cancer Maternal Grandmother     ovarian   . Cancer Paternal Grandfather     lung   . Asthma Brother   . Anesthesia  problems Neg Hx   . Hypotension Neg Hx   . Malignant hyperthermia Neg Hx   . Pseudochol deficiency Neg Hx     Social History:  reports that she quit smoking about 10 years ago. Her smoking use included Cigarettes. She has a .75 pack-year smoking history. She does not have any smokeless tobacco history on file. She reports that she drinks alcohol. She reports that she does not use illicit drugs.  Allergies:  Allergies  Allergen Reactions  . Percocet (Oxycodone-Acetaminophen) Itching    No prescriptions prior to admission    ROS  There were no vitals taken for this visit. weight 171 blood pressure 112/82  Physical Exam Physical Examination: General appearance - alert, well appearing, and in no distress and oriented to person, place, and time Mouth - mucous membranes moist, pharynx normal without lesions and dental hygiene good Chest - clear to auscultation, no wheezes, rales or rhonchi, symmetric air entry Heart - normal rate and regular rhythm Abdomen - soft, nontender, nondistended, no masses or organomegaly Pfannenstiel incision well healed Pelvic - normal external genitalia, vulva, vagina, cervix, uterus and adnexa uterus anterior deviated  to the right  No results found for this or any previous visit (from the past 24 hour(s)). Hemoglobin & Hematocrit     Component Value Date/Time   HGB 12.5 02/10/2012 0950   HCT 38.1 02/10/2012 0950    No results found.  Assessment/Plan: 1 status  post failed tubal ligation for bilateral salpingectomy 02/17/2012  2. Heavy periods for endometrial ablation 3. Anterior neck nevus for excision  Kyarah Enamorado V 02/16/2012, 7:35 PM

## 2012-02-17 ENCOUNTER — Ambulatory Visit (HOSPITAL_COMMUNITY)
Admission: RE | Admit: 2012-02-17 | Discharge: 2012-02-17 | Disposition: A | Discharge status: Home / Self Care | Payer: 59 | Source: Ambulatory Visit | Attending: Obstetrics and Gynecology | Admitting: Obstetrics and Gynecology

## 2012-02-17 ENCOUNTER — Encounter (HOSPITAL_COMMUNITY)
Admission: RE | Disposition: A | Discharge status: Home / Self Care | Payer: Self-pay | Source: Ambulatory Visit | Attending: Obstetrics and Gynecology

## 2012-02-17 ENCOUNTER — Ambulatory Visit (HOSPITAL_COMMUNITY): Payer: 59 | Admitting: Anesthesiology

## 2012-02-17 ENCOUNTER — Encounter (HOSPITAL_COMMUNITY): Payer: Self-pay | Admitting: Anesthesiology

## 2012-02-17 ENCOUNTER — Encounter (HOSPITAL_COMMUNITY): Payer: Self-pay | Admitting: *Deleted

## 2012-02-17 DIAGNOSIS — L919 Hypertrophic disorder of the skin, unspecified: Secondary | ICD-10-CM | POA: Insufficient documentation

## 2012-02-17 DIAGNOSIS — Z302 Encounter for sterilization: Secondary | ICD-10-CM | POA: Insufficient documentation

## 2012-02-17 DIAGNOSIS — N920 Excessive and frequent menstruation with regular cycle: Secondary | ICD-10-CM

## 2012-02-17 DIAGNOSIS — L909 Atrophic disorder of skin, unspecified: Secondary | ICD-10-CM | POA: Insufficient documentation

## 2012-02-17 DIAGNOSIS — IMO0002 Reserved for concepts with insufficient information to code with codable children: Secondary | ICD-10-CM | POA: Insufficient documentation

## 2012-02-17 DIAGNOSIS — Y849 Medical procedure, unspecified as the cause of abnormal reaction of the patient, or of later complication, without mention of misadventure at the time of the procedure: Secondary | ICD-10-CM | POA: Insufficient documentation

## 2012-02-17 DIAGNOSIS — Z01812 Encounter for preprocedural laboratory examination: Secondary | ICD-10-CM | POA: Insufficient documentation

## 2012-02-17 SURGERY — SALPINGECTOMY, BILATERAL, LAPAROSCOPIC
Anesthesia: General | Site: Neck | Wound class: Clean

## 2012-02-17 MED ORDER — ROCURONIUM BROMIDE 50 MG/5ML IV SOLN
INTRAVENOUS | Status: AC
  Filled 2012-02-17: qty 1

## 2012-02-17 MED ORDER — PROPOFOL 10 MG/ML IV EMUL
INTRAVENOUS | Status: AC
  Filled 2012-02-17: qty 20

## 2012-02-17 MED ORDER — GLYCOPYRROLATE 0.2 MG/ML IJ SOLN
INTRAMUSCULAR | Status: AC
  Filled 2012-02-17: qty 1

## 2012-02-17 MED ORDER — FENTANYL CITRATE 0.05 MG/ML IJ SOLN
INTRAMUSCULAR | Status: AC
  Filled 2012-02-17: qty 5

## 2012-02-17 MED ORDER — ACETAMINOPHEN 325 MG PO TABS: 325.0000 mg | ORAL_TABLET | ORAL | Status: DC | PRN

## 2012-02-17 MED ORDER — ROCURONIUM BROMIDE 100 MG/10ML IV SOLN
INTRAVENOUS | Status: DC | PRN
  Administered 2012-02-17: 30 mg via INTRAVENOUS

## 2012-02-17 MED ORDER — GLYCOPYRROLATE 0.2 MG/ML IJ SOLN
0.2000 mg | Freq: Once | INTRAMUSCULAR | Status: AC
  Administered 2012-02-17: 0.2 mg via INTRAVENOUS

## 2012-02-17 MED ORDER — FENTANYL CITRATE 0.05 MG/ML IJ SOLN
INTRAMUSCULAR | Status: DC | PRN
  Administered 2012-02-17 (×5): 50 ug via INTRAVENOUS

## 2012-02-17 MED ORDER — DEXTROSE 5 % IV SOLN
INTRAVENOUS | Status: DC | PRN
  Administered 2012-02-17: 500 mL via INTRAVENOUS

## 2012-02-17 MED ORDER — LIDOCAINE HCL 1 % IJ SOLN
INTRAMUSCULAR | Status: DC | PRN
  Administered 2012-02-17: 40 mg via INTRADERMAL

## 2012-02-17 MED ORDER — LACTATED RINGERS IV SOLN
INTRAVENOUS | Status: DC
  Administered 2012-02-17: 08:00:00 via INTRAVENOUS

## 2012-02-17 MED ORDER — IBUPROFEN 600 MG PO TABS: 600.0000 mg | ORAL_TABLET | Freq: Four times a day (QID) | ORAL | Status: AC | PRN

## 2012-02-17 MED ORDER — SODIUM CHLORIDE 0.9 % IR SOLN
Status: DC | PRN
  Administered 2012-02-17: 1000 mL

## 2012-02-17 MED ORDER — MIDAZOLAM HCL 2 MG/2ML IJ SOLN: 1.0000 mg | INTRAMUSCULAR | Status: DC | PRN

## 2012-02-17 MED ORDER — BUPIVACAINE-EPINEPHRINE PF 0.5-1:200000 % IJ SOLN
INTRAMUSCULAR | Status: AC
  Filled 2012-02-17: qty 10

## 2012-02-17 MED ORDER — ONDANSETRON HCL 4 MG/2ML IJ SOLN
INTRAMUSCULAR | Status: AC
  Filled 2012-02-17: qty 2

## 2012-02-17 MED ORDER — LIDOCAINE HCL (PF) 1 % IJ SOLN
INTRAMUSCULAR | Status: AC
  Filled 2012-02-17: qty 5

## 2012-02-17 MED ORDER — BUPIVACAINE-EPINEPHRINE 0.5% -1:200000 IJ SOLN
INTRAMUSCULAR | Status: DC | PRN
  Administered 2012-02-17: 30 mL

## 2012-02-17 MED ORDER — ONDANSETRON HCL 4 MG/2ML IJ SOLN
4.0000 mg | Freq: Once | INTRAMUSCULAR | Status: AC
  Administered 2012-02-17: 4 mg via INTRAVENOUS

## 2012-02-17 MED ORDER — ONDANSETRON HCL 4 MG/2ML IJ SOLN
4.0000 mg | Freq: Once | INTRAMUSCULAR | Status: AC | PRN
  Administered 2012-02-17: 4 mg via INTRAVENOUS

## 2012-02-17 MED ORDER — ONDANSETRON HCL 4 MG/2ML IJ SOLN
INTRAMUSCULAR | Status: AC
  Administered 2012-02-17: 4 mg via INTRAVENOUS
  Filled 2012-02-17: qty 2

## 2012-02-17 MED ORDER — MIDAZOLAM HCL 2 MG/2ML IJ SOLN
INTRAMUSCULAR | Status: AC
  Filled 2012-02-17: qty 2

## 2012-02-17 MED ORDER — FENTANYL CITRATE 0.05 MG/ML IJ SOLN: 25.0000 ug | INTRAMUSCULAR | Status: DC | PRN

## 2012-02-17 MED ORDER — OXYTOCIN 10 UNIT/ML IJ SOLN
INTRAMUSCULAR | Status: AC
  Filled 2012-02-17: qty 2

## 2012-02-17 MED ORDER — OXYTOCIN 20 UNITS IN LACTATED RINGERS INFUSION - SIMPLE
INTRAVENOUS | Status: DC | PRN
  Administered 2012-02-17: 125 mL/h via INTRAVENOUS

## 2012-02-17 MED ORDER — PROPOFOL 10 MG/ML IV BOLUS
INTRAVENOUS | Status: DC | PRN
  Administered 2012-02-17: 150 mg via INTRAVENOUS

## 2012-02-17 MED ORDER — SODIUM CHLORIDE 0.9 % IR SOLN
Status: DC | PRN
  Administered 2012-02-17: 3000 mL

## 2012-02-17 MED ORDER — HYDROCODONE-ACETAMINOPHEN 7.5-500 MG PO TABS: 1.0000 | ORAL_TABLET | Freq: Four times a day (QID) | ORAL | Status: AC | PRN

## 2012-02-17 SURGICAL SUPPLY — 36 items
BAG DECANTER FOR FLEXI CONT (MISCELLANEOUS) ×4 IMPLANT
BAG HAMPER (MISCELLANEOUS) ×4 IMPLANT
BLADE SURG SZ11 CARB STEEL (BLADE) ×4 IMPLANT
CATH ROBINSON RED A/P 16FR (CATHETERS) ×4 IMPLANT
CATH THERMACHOICE III (CATHETERS) ×4 IMPLANT
CLOTH BEACON ORANGE TIMEOUT ST (SAFETY) ×4 IMPLANT
COVER SURGICAL LIGHT HANDLE (MISCELLANEOUS) ×8 IMPLANT
DRESSING COVERLET 3X1 FLEXIBLE (GAUZE/BANDAGES/DRESSINGS) ×4 IMPLANT
FORMALIN 10 PREFIL 480ML (MISCELLANEOUS) ×4 IMPLANT
GLOVE ECLIPSE 9.0 STRL (GLOVE) ×4 IMPLANT
GLOVE EXAM NITRILE MD LF STRL (GLOVE) ×4 IMPLANT
GLOVE INDICATOR 7.0 STRL GRN (GLOVE) ×4 IMPLANT
GLOVE INDICATOR STER SZ 9 (GLOVE) ×8 IMPLANT
GOWN STRL REIN 3XL LVL4 (GOWN DISPOSABLE) ×4 IMPLANT
GOWN STRL REIN XL XLG (GOWN DISPOSABLE) ×8 IMPLANT
INST SET HYSTEROSCOPY (KITS) ×4 IMPLANT
IV D5W 500ML (IV SOLUTION) ×4 IMPLANT
IV NS IRRIG 3000ML ARTHROMATIC (IV SOLUTION) ×4 IMPLANT
KIT ROOM TURNOVER AP CYSTO (KITS) ×4 IMPLANT
KIT TROCAR LAP GYN (TROCAR) ×4 IMPLANT
MANIFOLD NEPTUNE II (INSTRUMENTS) ×4 IMPLANT
MANIFOLD NEPTUNE WASTE (CANNULA) ×4 IMPLANT
NS IRRIG 1000ML POUR BTL (IV SOLUTION) ×4 IMPLANT
PACK PERI GYN (CUSTOM PROCEDURE TRAY) ×4 IMPLANT
PAD ARMBOARD 7.5X6 YLW CONV (MISCELLANEOUS) ×4 IMPLANT
PAD TELFA 3X4 1S STER (GAUZE/BANDAGES/DRESSINGS) ×4 IMPLANT
POUCH SPECIMEN RETRIEVAL 10MM (ENDOMECHANICALS) ×4 IMPLANT
SCALPEL HARMONIC ACE (MISCELLANEOUS) ×4 IMPLANT
SET BASIN LINEN APH (SET/KITS/TRAYS/PACK) ×4 IMPLANT
SET IRRIG Y TYPE TUR BLADDER L (SET/KITS/TRAYS/PACK) ×4 IMPLANT
SOLUTION ANTI FOG 6CC (MISCELLANEOUS) ×4 IMPLANT
STRIP CLOSURE SKIN 1/2X4 (GAUZE/BANDAGES/DRESSINGS) ×4 IMPLANT
SYR CONTROL 10ML LL (SYRINGE) ×4 IMPLANT
SYRINGE 10CC LL (SYRINGE) ×4 IMPLANT
TROCAR Z-THAD FIOS HNDL 12X100 (TROCAR) ×4 IMPLANT
TUBING INSUFFLATION HIGH FLOW (TUBING) ×4 IMPLANT

## 2012-02-17 NOTE — Interval H&P Note (Signed)
History and Physical Interval Note:  02/17/2012 7:32 AM  [name]  has presented today for surgery, with the diagnosis of sterilization request menometrorrhagia skin tag of neck*see NOTE BELOW.  The various methods of treatment have been discussed with the patient and family. After consideration of risks, benefits and other options for treatment, the patient has consented to  Procedure(s) (LRB): LAPAROSCOPIC BILATERAL SALPINGECTOMY (Bilateral) DILATATION & CURETTAGE/HYSTEROSCOPY WITH THERMACHOICE ABLATION (N/A) LESION REMOVAL (N/A) as a surgical intervention .  The patients' history has been reviewed, patient examined, no change in status, stable for surgery.  I have reviewed the patients' chart and labs.  Questions were answered to the patient's satisfaction.     [name]  The patient has decided NOT to have the skin tag on the neck removed. No other changes in surgical plan or procedure.

## 2012-02-17 NOTE — Discharge Instructions (Addendum)
Hysteroscopy Hysteroscopy is a procedure used for looking inside the womb (uterus). It may be done for many different reasons, including:  To evaluate abnormal bleeding, fibroid (benign, noncancerous) tumors, polyps, scar tissue (adhesions), and possibly cancer of the uterus.   To look for lumps (tumors) and other uterine growths.   To look for causes of why a woman cannot get pregnant (infertility), causes of recurrent loss of pregnancy (miscarriages), or a lost intrauterine device (IUD).   To perform a sterilization by blocking the fallopian tubes from inside the uterus.  A hysteroscopy should be done right after a menstrual period to be sure you are not pregnant. LET YOUR CAREGIVER KNOW ABOUT:   Allergies.   Medicines taken, including herbs, eyedrops, over-the-counter medicines, and creams.   Use of steroids (by mouth or creams).   Previous problems with anesthetics or numbing medicines.   History of bleeding or blood problems.   History of blood clots.   Possibility of pregnancy, if this applies.   Previous surgery.   Other health problems.  RISKS AND COMPLICATIONS   Putting a hole in the uterus.   Excessive bleeding.   Infection.   Damage to the cervix.   Injury to other organs.   Allergic reaction to medicines.   Too much fluid used in the uterus for the procedure.  BEFORE THE PROCEDURE   Do not take aspirin or blood thinners for a week before the procedure, or as directed. It can cause bleeding.   Arrive at least 60 minutes before the procedure or as directed to read and sign the necessary forms.   Arrange for someone to take you home after the procedure.   If you smoke, do not smoke for 2 weeks before the procedure.  PROCEDURE   Your caregiver may give you medicine to relax you. He or she may also give you a medicine that numbs the area around the cervix (local anesthetic) or a medicine that makes you sleep (general anesthesia).   Sometimes, a  medicine is placed in the cervix the day before the procedure. This medicine makes the cervix have a larger opening (dilate). This makes it easier for the instrument to be inserted into the uterus.   A small instrument (hysteroscope) is inserted through the vagina into the uterus. This instrument is similar to a pencil-sized telescope with a light.   During the procedure, air or a liquid is put into the uterus, which allows the surgeon to see better.   Sometimes, tissue is gently scraped from inside the uterus. These tissue samples are sent to a specialist who looks at tissue samples (pathologist). The pathologist will give a report to your caregiver. This will help your caregiver decide if further treatment is necessary. The report will also help your caregiver decide on the best treatment if the test comes back abnormal.  AFTER THE PROCEDURE   If you had a general anesthetic, you may be groggy for a couple hours after the procedure.   If you had a local anesthetic, you will be advised to rest at the surgical center or caregiver's office until you are stable and feel ready to go home.   You may have some cramping for a couple days.   You may have bleeding, which varies from light spotting for a few days to menstrual-like bleeding for up to 3 to 7 days. This is normal.   Have someone take you home.  FINDING OUT THE RESULTS OF YOUR TEST Not all test results   are available during your visit. If your test results are not back during the visit, make an appointment with your caregiver to find out the results. Do not assume everything is normal if you have not heard from your caregiver or the medical facility. It is important for you to follow up on all of your test results. HOME CARE INSTRUCTIONS   Do not drive for 24 hours or as instructed.   Only take over-the-counter or prescription medicines for pain, discomfort, or fever as directed by your caregiver.   Do not take aspirin. It can cause or  aggravate bleeding.   Do not drive or drink alcohol while taking pain medicine.   You may resume your usual diet.   Do not use tampons, douche, or have sexual intercourse for 2 weeks, or as advised by your caregiver.   Rest and sleep for the first 24 to 48 hours.   Take your temperature twice a day for 4 to 5 days. Write it down. Give these temperatures to your caregiver if they are abnormal (above 98.6 F or 37.0 C).   Take medicines your caregiver has ordered as directed.   Follow your caregiver's advice regarding diet, exercise, lifting, driving, and general activities.   Take showers instead of baths for 2 weeks, or as recommended by your caregiver.   If you develop constipation:   Take a mild laxative with the advice of your caregiver.   Eat bran foods.   Drink enough water and fluids to keep your urine clear or pale yellow.   Try to have someone with you or available to you for the first 24 to 48 hours, especially if you had a general anesthetic.   Make sure you and your family understand everything about your operation and recovery.   Follow your caregiver's advice regarding follow-up appointments and Pap smears.  SEEK MEDICAL CARE IF:   You feel dizzy or lightheaded.   You feel sick to your stomach (nauseous).   You develop abnormal vaginal discharge.   You develop a rash.   You have an abnormal reaction or allergy to your medicine.   You need stronger pain medicine.  SEEK IMMEDIATE MEDICAL CARE IF:   Bleeding is heavier than a normal menstrual period or you have blood clots.   You have an oral temperature above 102 F (38.9 C), not controlled by medicine.   You have increasing cramps or pains not relieved with medicine.   You develop belly (abdominal) pain that does not seem to be related to the same area of earlier cramping and pain.   You pass out.   You develop pain in the tops of your shoulders (shoulder strap areas).   You develop shortness of  breath.  MAKE SURE YOU:   Understand these instructions.   Will watch your condition.   Will get help right away if you are not doing well or get worse.  Document Released: 01/23/2001 Document Revised: 10/06/2011 Document Reviewed: 05/18/2009 [employer] Patient Information 2012 [employer]. 

## 2012-02-17 NOTE — Brief Op Note (Signed)
02/17/2012  9:39 AM  PATIENT:  [name]  26 y.o. female  PRE-OPERATIVE DIAGNOSIS:  sterilization request after prior failed tubal menometrorrhagia skin tag of neck  POST-OPERATIVE DIAGNOSIS:  sterilization request after prior failed tubal ligation menometrorrhagia  PROCEDURE:  Procedure(s) (LRB): LAPAROSCOPIC BILATERAL SALPINGECTOMY (Bilateral) DILATATION & CURETTAGE/HYSTEROSCOPY WITH THERMACHOICE ABLATION (N/A)  SURGEON:  Surgeon(s) and Role:    * Tilda Burrow, MD - Primary  PHYSICIAN ASSISTANT:   ASSISTANTS: [place] CST-FA   ANESTHESIA:   local and general  EBL:  Total I/O In: 1000 [I.V.:1000] Out: 40 [Urine:30; Blood:10]  BLOOD ADMINISTERED:none  DRAINS: none   LOCAL MEDICATIONS USED:  MARCAINE    and Amount: 30 mL  SPECIMEN:  Source of Specimen:  Bilateral fallopian tubes  DISPOSITION OF SPECIMEN:  PATHOLOGY  COUNTS:  YES  TOURNIQUET:  * No tourniquets in log *  DICTATION: .Dragon Dictation Patient was taken to the operating room prepped and draped for combined abdominal and vaginal procedure with Foley catheter in place. Timeout was conducted and procedure initiated by skin incision at the umbilicus suprapubic area and right lower quadrant. A 10 mm trocar was placed through the abdominal cavity after Veress needle had been used for pneumoperitoneum. There was no evidence of trauma to the abdominal contents, and suprapubic and right lower quadrant trochars were placed under direct visualization. Attention was then directed to the pelvis where the fallopian tube could be identified and showed evidence of prior transection. There was no gross abnormalities of tubes other than the left tube was extremely hyperemic. The harmonic scalpel was used to perform salpingectomy bilaterally with excellent hemostasis. Specimens were extracted through the suprapubic trocar site. Saline 120 cc was introduced into the abdomen the abdomen deflated and this instruments  removed and the fascia closed at the umbilicus and suprapubic area with 0 Vicryl, and all 3 ports was closed subcuticularly. Steri-Strips were applied.  Vaginal portion of procedure was then initiated. The operator moved to the vaginal position, grasped the cervix with a single-tooth tenaculum with speculum in place, and sounded the uterus to 8 cm dilated in the cervix to 23 [place], and introduce the rigid 30 hysteroscope to confirm tubal ostia, and confirmed no evidence of trauma to or pathology were noted. The Gynecare ThermaChoice 3 endometrial ablation device was then repaired inserted and the 8 minute thermal ablation sequence completed with 19 cc of fluid in place, with full recovery of the defect upon completion of the procedure. Paracervical block with 30 cc of Marcaine was injected, Foley catheter removed, and procedure considered successfully performed. Sponge and needle counts were correct patient to recovery in good condition  PLAN OF CARE: Discharge to home after PACU  PATIENT DISPOSITION:  PACU - hemodynamically stable.   Delay start of Pharmacological VTE agent (>24hrs) due to surgical blood loss or risk of bleeding: not applicable

## 2012-02-17 NOTE — Anesthesia Postprocedure Evaluation (Signed)
  Anesthesia Post-op Note  Patient: [name]  Procedure(s) Performed: Procedure(s) (LRB): LAPAROSCOPIC BILATERAL SALPINGECTOMY (Bilateral) DILATATION & CURETTAGE/HYSTEROSCOPY WITH THERMACHOICE ABLATION (N/A)  Patient Location: PACU  Anesthesia Type: General  Level of Consciousness: sedated  Airway and Oxygen Therapy: Patient Spontanous Breathing and Patient connected to face mask oxygen  Post-op Pain: none  Post-op Assessment: Post-op Vital signs reviewed, Patient's Cardiovascular Status Stable, Respiratory Function Stable, Patent Airway and No signs of Nausea or vomiting  Post-op Vital Signs: Reviewed and stable  Complications: No apparent anesthesia complications

## 2012-02-17 NOTE — Op Note (Signed)
See detailed operative note details included in the brief operative note.

## 2012-02-17 NOTE — Transfer of Care (Signed)
Immediate Anesthesia Transfer of Care Note  Patient: [name]  Procedure(s) Performed: Procedure(s) (LRB): LAPAROSCOPIC BILATERAL SALPINGECTOMY (Bilateral) DILATATION & CURETTAGE/HYSTEROSCOPY WITH THERMACHOICE ABLATION (N/A)  Patient Location: PACU  Anesthesia Type: General  Level of Consciousness: sedated  Airway & Oxygen Therapy: Patient Spontanous Breathing and Patient connected to face mask oxygen  Post-op Assessment: Report given to PACU RN  Post vital signs: Reviewed and stable  Complications: No apparent anesthesia complications

## 2012-02-17 NOTE — Anesthesia Preprocedure Evaluation (Signed)
Anesthesia Evaluation  Patient identified by MRN, date of birth, ID band Patient awake    Reviewed: Allergy & Precautions, H&P , NPO status , Patient's Chart, lab work & pertinent test results  Airway Mallampati: I TM Distance: >3 FB Neck ROM: Full    Dental No notable dental hx.    Pulmonary neg pulmonary ROS,    Pulmonary exam normal       Cardiovascular negative cardio ROS  Rhythm:Regular Rate:Normal     Neuro/Psych negative neurological ROS  negative psych ROS   GI/Hepatic negative GI ROS, Neg liver ROS,   Endo/Other  negative endocrine ROS  Renal/GU negative Renal ROS     Musculoskeletal negative musculoskeletal ROS (+)   Abdominal Normal abdominal exam  (+)   Peds  Hematology negative hematology ROS (+)   Anesthesia Other Findings   Reproductive/Obstetrics negative OB ROS                           Anesthesia Physical Anesthesia Plan  ASA: I  Anesthesia Plan: General   Post-op Pain Management:    Induction: Intravenous  Airway Management Planned: Oral ETT  Additional Equipment:   Intra-op Plan:   Post-operative Plan: Extubation in OR  Informed Consent: I have reviewed the patients History and Physical, chart, labs and discussed the procedure including the risks, benefits and alternatives for the proposed anesthesia with the patient or authorized representative who has indicated his/her understanding and acceptance.     Plan Discussed with: CRNA  Anesthesia Plan Comments:         Anesthesia Quick Evaluation

## 2012-02-17 NOTE — Anesthesia Procedure Notes (Signed)
Procedure Name: Intubation Date/Time: 02/17/2012 7:53 AM Performed by: Glynn Octave E Pre-anesthesia Checklist: Patient identified, Patient being monitored, Timeout performed, Emergency Drugs available and Suction available Patient Re-evaluated:Patient Re-evaluated prior to inductionOxygen Delivery Method: Circle System Utilized Preoxygenation: Pre-oxygenation with 100% oxygen Intubation Type: IV induction Ventilation: Mask ventilation without difficulty Laryngoscope Size: Mac and 3 Grade View: Grade II Tube type: Oral Tube size: 7.0 mm Number of attempts: 1 Airway Equipment and Method: stylet Placement Confirmation: ETT inserted through vocal cords under direct vision,  positive ETCO2 and breath sounds checked- equal and bilateral Secured at: 21 cm Tube secured with: Tape Dental Injury: Teeth and Oropharynx as per pre-operative assessment

## 2012-05-16 ENCOUNTER — Ambulatory Visit (INDEPENDENT_AMBULATORY_CARE_PROVIDER_SITE_OTHER): Payer: 59 | Admitting: Family Medicine

## 2012-05-16 ENCOUNTER — Encounter: Payer: Self-pay | Admitting: Family Medicine

## 2012-05-16 VITALS — BP 118/82 | HR 66 | Resp 18 | Ht 63.5 in | Wt 168.0 lb

## 2012-05-16 DIAGNOSIS — F32A Depression, unspecified: Secondary | ICD-10-CM | POA: Insufficient documentation

## 2012-05-16 DIAGNOSIS — F329 Major depressive disorder, single episode, unspecified: Secondary | ICD-10-CM

## 2012-05-16 MED ORDER — BUPROPION HCL ER (XL) 150 MG PO TB24: 150.0000 mg | ORAL_TABLET | Freq: Every day | ORAL | Status: DC
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
## 2012-05-16 NOTE — Patient Instructions (Addendum)
I will refer you to a therapist  We will contact you about marriage therapist as well Start the wellbutrin in the morning Continue xanax as needed F/U 2 weeks You are a great mother !

## 2012-05-16 NOTE — Assessment & Plan Note (Addendum)
Her PHQ 9 as well as interview today shows major depression. Will start wellbutrin 150mg  XL daily, she will continue xanax We discussed therapy and pt agrees, will try to set up both individual and marriage therapy referral placed RTC 2 weeks Contracted for safety  Approximately 20 minutes spent with patient greater than 50% spent on counseling for depression

## 2012-05-29 ENCOUNTER — Encounter: Payer: Self-pay | Admitting: Family Medicine

## 2012-05-29 ENCOUNTER — Ambulatory Visit (INDEPENDENT_AMBULATORY_CARE_PROVIDER_SITE_OTHER): Payer: 59 | Admitting: Family Medicine

## 2012-05-29 VITALS — BP 112/72 | HR 88 | Resp 18 | Ht 63.5 in | Wt 166.0 lb

## 2012-05-29 DIAGNOSIS — F329 Major depressive disorder, single episode, unspecified: Secondary | ICD-10-CM

## 2012-05-29 NOTE — Progress Notes (Signed)
  Subjective:    Patient ID: [name], female    DOB: 1986-05-04, 26 y.o.   MRN: [id number]  HPI  Pt presents for intermin visit to f/u medications. She was started on wellbutrin 150mg  XL 2 weeks ago for depression, she has not felt much change in her mood, she continues to perform her daily work activities, she has indivual therapy set up, her husband currently refuses marriage counseling, no side effects with medication.  Her mother is assisting her now with care of her grandmother. Her children are doing well   Review of Systems   GEN- denies fatigue, fever, weight loss,weakness, recent illness HEENT- denies eye drainage, change in vision, nasal discharge, CVS- denies chest pain, palpitations RESP- denies SOB, cough, wheeze ABD- denies N/V, change in stools, abd pain GU- denies dysuria, hematuria, dribbling, incontinence MSK- denies joint pain, muscle aches, injury Neuro- denies headache, dizziness, syncope, seizure activity      Objective:   Physical Exam GEN-NAD, alert and oriented x 3  Psych- not overly depressed or anxious appearing       Assessment & Plan:

## 2012-05-29 NOTE — Assessment & Plan Note (Addendum)
Pt has made some good choices for her own health these past 2 weeks, therapy is set up, she is trying to work through things with her husband I will continue to follow- this and assist in anyway possible. Continue Wellbutrin and reassess in 8 weeks  Rare use of xanax at this time.   Approximately 10 minutes spent with patient > 50% spent on counseling and medication management

## 2012-05-29 NOTE — Patient Instructions (Signed)
F/U 8 weeks Continue current medications

## 2012-06-05 ENCOUNTER — Encounter: Payer: Self-pay | Admitting: Family Medicine

## 2012-06-05 MED ORDER — PREDNISONE 10 MG PO TABS: ORAL_TABLET | ORAL | Status: DC

## 2012-06-05 NOTE — Progress Notes (Signed)
Subjective:     Patient ID: [name], female   DOB: 09-23-1986, 26 y.o.   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 Some if it is clearing but she has a few new lesions that are itchy   maculopapular rash on forearms, shotty posterior LAD, no oral lesions  RASH- appears to be contact dermatitis, doubt due to meds this far out, given solumedrol IM in office, short course of prednisone

## 2012-08-09 ENCOUNTER — Ambulatory Visit (INDEPENDENT_AMBULATORY_CARE_PROVIDER_SITE_OTHER): Payer: 59 | Admitting: Family Medicine

## 2012-08-09 ENCOUNTER — Encounter: Payer: Self-pay | Admitting: Family Medicine

## 2012-08-09 VITALS — BP 120/78 | HR 76 | Resp 18 | Ht 63.5 in | Wt 172.1 lb

## 2012-08-09 DIAGNOSIS — E669 Obesity, unspecified: Secondary | ICD-10-CM

## 2012-08-09 DIAGNOSIS — F329 Major depressive disorder, single episode, unspecified: Secondary | ICD-10-CM

## 2012-08-09 NOTE — Patient Instructions (Signed)
Continue current medications Set goal for weight loss Increase exercise, fruits and veggies with each meal Try packing Lunch F/U 1 year unless otherwise needed

## 2012-08-09 NOTE — Assessment & Plan Note (Addendum)
Short term goals set, she needs to work on portion control and preparing ahead of time for meals Normal FLP in 2012

## 2012-08-09 NOTE — Assessment & Plan Note (Signed)
PHQ 9, score 8 which is much improved, continue marriage therapy Hold on further medications

## 2012-08-09 NOTE — Progress Notes (Signed)
  Subjective:    Patient ID: [name], female    DOB: 07-29-1986, 26 y.o.   MRN: [id number]  HPI Patient here to followup depression. She stopped the Wellbutrin after she had allergic reaction- she broke out in hives and began having hallucinations.. She never started therapy however started marriage counseling with her church and this is been going well. She's been communicating better with her husband and things have improved. 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 She has some insomnia but thinks this is due to previously working third shift and occasionally takes a Tylenol PM She's not been exercising as she previously was and tends to overeat. She has a goal to lose weight. She rarely uses Xanax and is still on the first prescription   Review of Systems  GEN- denies fatigue, fever, weight loss,weakness, recent illness HEENT- denies eye drainage, change in vision, nasal discharge, CVS- denies chest pain, palpitations RESP- denies SOB, cough, wheeze ABD- denies N/V, change in stools, abd pain GU- denies dysuria, hematuria, dribbling, incontinence MSK- denies joint pain, muscle aches, injury Neuro- denies headache, dizziness, syncope, seizure activity      Objective:   Physical Exam GEN-NAD,alert and oriented x 3, overweight  Psych-normal affect and mood, no SI       Assessment & Plan:

## 2012-10-09 ENCOUNTER — Telehealth: Payer: Self-pay

## 2012-10-09 ENCOUNTER — Ambulatory Visit (INDEPENDENT_AMBULATORY_CARE_PROVIDER_SITE_OTHER): Payer: 59 | Admitting: Family Medicine

## 2012-10-09 VITALS — BP 110/78 | HR 72 | Resp 18 | Ht 63.5 in | Wt 173.1 lb

## 2012-10-09 DIAGNOSIS — R209 Unspecified disturbances of skin sensation: Secondary | ICD-10-CM

## 2012-10-09 NOTE — Telephone Encounter (Signed)
Schedule in appt for exam today If beginnings of bells palsy may need steroids

## 2012-10-09 NOTE — Telephone Encounter (Signed)
Noted  

## 2012-10-09 NOTE — Patient Instructions (Signed)
Call if you do not improve  Prednisone will be called if needed for bell's palsy

## 2012-10-10 ENCOUNTER — Encounter: Payer: Self-pay | Admitting: Family Medicine

## 2012-10-10 DIAGNOSIS — R209 Unspecified disturbances of skin sensation: Secondary | ICD-10-CM | POA: Insufficient documentation

## 2012-10-10 NOTE — Progress Notes (Signed)
  Subjective:    Patient ID: [name], female    DOB: 06-Oct-1986, 26 y.o.   MRN: [id number]  HPI  Pt presents with left sided facial twitching and and numbness that began today.Twitching now resolved. 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 The second episode occurred after dental treatment. She had URI 2 weeks ago now resolved. No changes in speech, vision, no drainage from eye or nose. No fever, no weakness in arms or legs, no neck pain. Mild headache this AM   Review of Systems   GEN- denies fatigue, fever, weight loss,weakness, recent illness HEENT- denies eye drainage, change in vision, nasal discharge, CVS- denies chest pain, palpitations RESP- denies SOB, cough, wheeze Neuro- + headache, dizziness, syncope, seizure activity      Objective:   Physical Exam GEN-NAD, alert and oriented x3 HEENT- PERRL, EOMI, non icteric, MMM, oropharynx clear, conjunctiva non injected Neuro- CNII-XII in tact with exception of mild decreased sensation over left cheek, no twitching or spasms of face, normal symmetry of face, normal nasolabial folds, no focal deficits, moving all 4 ext, normal tone Skin- in tact, no rash       Assessment & Plan:

## 2012-10-10 NOTE — Assessment & Plan Note (Signed)
At this time no true signs of bells palsy, twitching has resolved. Very small area of abnormal skin sensation.  Will have her monitor skin sensation and any other symptoms, if this progresses will treat for bells palsy with prednisone taper based on her history

## 2012-11-02 ENCOUNTER — Telehealth: Payer: Self-pay | Admitting: Family Medicine

## 2012-11-02 ENCOUNTER — Other Ambulatory Visit: Payer: Self-pay

## 2012-11-02 MED ORDER — SERTRALINE HCL 100 MG PO TABS: 100.0000 mg | ORAL_TABLET | Freq: Every day | ORAL | Status: DC

## 2012-11-02 MED ORDER — ALPRAZOLAM 0.25 MG PO TABS: ORAL_TABLET | ORAL | Status: DC

## 2012-11-02 NOTE — Telephone Encounter (Signed)
Pt seen in office briefly, under a lot of stress, not sleeping as well, has been using the xanax. Would like to try Zoloft  She had a reaction with wellbutrin and did not help Start 50mg  daily , increase to 100mg  in 1 week, f/u in office 2 weeks

## 2012-11-29 ENCOUNTER — Encounter: Payer: Self-pay | Admitting: Family Medicine

## 2012-11-29 ENCOUNTER — Ambulatory Visit (INDEPENDENT_AMBULATORY_CARE_PROVIDER_SITE_OTHER): Payer: 59 | Admitting: Family Medicine

## 2012-11-29 VITALS — BP 112/70 | HR 84 | Resp 16 | Ht 63.5 in | Wt 170.4 lb

## 2012-11-29 DIAGNOSIS — L988 Other specified disorders of the skin and subcutaneous tissue: Secondary | ICD-10-CM

## 2012-11-29 DIAGNOSIS — R238 Other skin changes: Secondary | ICD-10-CM | POA: Insufficient documentation

## 2012-11-29 DIAGNOSIS — F329 Major depressive disorder, single episode, unspecified: Secondary | ICD-10-CM

## 2012-11-29 NOTE — Assessment & Plan Note (Signed)
Small papule felt, not a clear abscess, will have her monitor has been there  < 24 hours, no I and D needed or antibiotics, compresses, she will call if this enlarges or drains Would treat with keflex

## 2012-11-29 NOTE — Progress Notes (Signed)
  Subjective:    Patient ID: [name], female    DOB: 1986/01/13, 27 y.o.   MRN: [id number]  HPI Patient here to followup depression as well as boil on leg. She was started on Zoloft approximately 2 and half weeks ago she's been under more stress at home and all she wanted to try another medication. She's not using her Xanax she and her husband are getting along much better and her mood has improved she is also sleeping better. She's not had any side effects with the medication with the exception of nausea on the first couple days after starting which is now resolved. Yesterday she noticed a bump on her left buttocks she did try to pop it and there was a small amount of fluid that came out it, denies any bug bites.   Review of Systems - per above   GEN- denies fatigue, fever, weight loss,weakness, recent illness HEENT- denies eye drainage, change in vision, nasal discharge, CVS- denies chest pain, palpitations RESP- denies SOB, cough, wheeze Neuro- denies headache, dizziness, syncope, seizure activity      Objective:   Physical Exam GEN- NAD, alert and oriented x3 Skin- Left buttocks at cleft connecting to thigh- small papule, no erythema, no fluctuance, no drainage, mild TTP Psych- normal affect and mood, no hallucinations, good eye contact         Assessment & Plan:

## 2012-11-29 NOTE — Assessment & Plan Note (Signed)
Doing well on zoloft, will continue at 100mg , recheck in 2 months, relationship has improved with husband, she may be able to taper off medications at the next visit

## 2013-01-02 ENCOUNTER — Other Ambulatory Visit: Payer: Self-pay

## 2013-01-02 MED ORDER — FEXOFENADINE HCL 180 MG PO TABS: 180.0000 mg | ORAL_TABLET | Freq: Every day | ORAL | Status: DC

## 2013-01-03 ENCOUNTER — Telehealth: Payer: Self-pay

## 2013-01-03 MED ORDER — GUAIFENESIN-CODEINE 100-10 MG/5ML PO SYRP: 5.0000 mL | ORAL_SOLUTION | Freq: Three times a day (TID) | ORAL | Status: DC | PRN

## 2013-01-03 NOTE — Telephone Encounter (Signed)
Pt has been coughing for a few days. Would like to get some cough medicine called in

## 2013-01-03 NOTE — Telephone Encounter (Signed)
Cough nonproductive no fever. Will call in Robitussin with codeine cough is keeping her up mostly at night.

## 2013-02-08 ENCOUNTER — Ambulatory Visit (INDEPENDENT_AMBULATORY_CARE_PROVIDER_SITE_OTHER): Payer: 59 | Admitting: Family Medicine

## 2013-02-08 ENCOUNTER — Encounter: Payer: Self-pay | Admitting: Family Medicine

## 2013-02-08 VITALS — BP 114/74 | HR 100 | Temp 98.7°F | Resp 18 | Ht 63.5 in | Wt 178.0 lb

## 2013-02-08 DIAGNOSIS — J209 Acute bronchitis, unspecified: Secondary | ICD-10-CM

## 2013-02-08 MED ORDER — AZITHROMYCIN 250 MG PO TABS: ORAL_TABLET | ORAL | Status: AC

## 2013-02-08 MED ORDER — CEFTRIAXONE SODIUM 1 G IJ SOLR
1.0000 g | Freq: Once | INTRAMUSCULAR | Status: AC
  Administered 2013-02-08: 1 g via INTRAMUSCULAR

## 2013-02-08 MED ORDER — METHYLPREDNISOLONE ACETATE 80 MG/ML IJ SUSP
80.0000 mg | Freq: Once | INTRAMUSCULAR | Status: AC
  Administered 2013-02-08: 40 mg via INTRAMUSCULAR

## 2013-02-08 NOTE — Progress Notes (Signed)
  Subjective:    Patient ID: [name], female    DOB: 07-16-1986, 27 y.o.   MRN: [id number]  HPI  Patient here secondary to continued cough with nasal congestion and runny nose for the past 2 weeks. Her cough is worsening it is now productive of green-yellow sputum. 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 She has had a flu shot. She denies any recent fever but had fever about a week ago. She's been taking nasal saline and Robitussin A.C. with minimal relief. Denies any wheezing or shortness of breath.  Review of Systems  GEN- denies fatigue, fever, weight loss,weakness, recent illness HEENT- denies eye drainage, change in vision, +nasal discharge, CVS- denies chest pain, palpitations RESP- denies SOB, +cough, wheeze ABD- denies N/V, change in stools, abd pain Neuro- denies headache, dizziness, syncope, seizure activity      Objective:   Physical Exam   GEN- NAD, alert and oriented x3 HEENT- PERRL, EOMI, non injected sclera, pink conjunctiva, MMM, oropharynx clear, TM clear bilat no effusion, nares clear rhinorrhea, slightly horse voice Neck- Supple, no LAD CVS- RRR, no murmur RESP-CTAB, some upper airway congestion EXT- No edema Pulses- Radial 2+        Assessment & Plan:

## 2013-02-08 NOTE — Addendum Note (Signed)
Addended by: Abner Greenspan on: 02/08/2013 04:10 PM   Modules accepted: Orders

## 2013-02-08 NOTE — Patient Instructions (Signed)
Start antibiotics tomorrow Plenty of fluids Continue cough medication Call if you do not improve F/U as needed

## 2013-02-08 NOTE — Assessment & Plan Note (Signed)
Would treat as bronchitis based on duration of symptoms and worsening symptoms. Will have her continue the Robitussin as this is getting her sputum. I will cover her with Rocephin IM and Solu-Medrol in the office . She can start Augmentin tomorrow. She has no wheezing so I will hold on the inhaler. Increase fluids Tylenol for any fever

## 2013-02-12 ENCOUNTER — Telehealth: Payer: Self-pay

## 2013-02-12 MED ORDER — FLUCONAZOLE 150 MG PO TABS: 150.0000 mg | ORAL_TABLET | Freq: Once | ORAL | Status: DC

## 2013-02-12 NOTE — Telephone Encounter (Signed)
Patient aware.

## 2013-02-12 NOTE — Telephone Encounter (Signed)
Wants to know if she can get a diflucan sent in

## 2013-02-12 NOTE — Telephone Encounter (Signed)
Okay to send, instructions 1 tablet now, may repeat in 3 days, dispense 2, refill 1

## 2013-03-12 ENCOUNTER — Telehealth: Payer: Self-pay

## 2013-03-12 MED ORDER — PHENTERMINE HCL 37.5 MG PO TABS: 37.5000 mg | ORAL_TABLET | Freq: Every day | ORAL | Status: DC

## 2013-03-12 NOTE — Telephone Encounter (Addendum)
I spoke with patient. She's having difficulty loosing weight on her own she has changed her diet she is exercising almost 2 miles a day mostly walking because running cause her pain in her knees. She has stopped her can sodas and is no longer eating fast food however she has only lost a couple pounds she last weighed in the 176 pounds. In the past she was on Wellbutrin for her move but did not do well on this medication denies any current anxiety or depression symptoms.  We discussed the use of phentermine we will try phentermine one half tablet daily if not helping increase to 1 tablet along with exercise and 1500-calorie diet F/U in office 1 month

## 2013-03-12 NOTE — Telephone Encounter (Signed)
Would like to know if she could get a medicine to help her with weight loss or a mood stabilizing medicine that helps with weight loss.

## 2013-03-13 ENCOUNTER — Other Ambulatory Visit: Payer: Self-pay

## 2013-03-13 MED ORDER — FEXOFENADINE HCL 180 MG PO TABS: 180.0000 mg | ORAL_TABLET | Freq: Every day | ORAL | Status: DC

## 2013-04-19 ENCOUNTER — Ambulatory Visit (INDEPENDENT_AMBULATORY_CARE_PROVIDER_SITE_OTHER): Payer: 59 | Admitting: Family Medicine

## 2013-04-19 ENCOUNTER — Encounter: Payer: Self-pay | Admitting: Family Medicine

## 2013-04-19 VITALS — BP 110/80 | HR 78 | Temp 97.3°F | Resp 18 | Ht 62.0 in | Wt 168.0 lb

## 2013-04-19 DIAGNOSIS — E669 Obesity, unspecified: Secondary | ICD-10-CM

## 2013-04-19 LAB — CBC WITH DIFFERENTIAL/PLATELET
Basophils Absolute: 0 10*3/uL (ref 0.0–0.1)
Basophils Relative: 1 % (ref 0–1)
Eosinophils Absolute: 0.4 10*3/uL (ref 0.0–0.7)
Hemoglobin: 13.5 g/dL (ref 12.0–15.0)
MCH: 28.6 pg (ref 26.0–34.0)
MCHC: 34.2 g/dL (ref 30.0–36.0)
Monocytes Relative: 7 % (ref 3–12)
Neutro Abs: 2.9 10*3/uL (ref 1.7–7.7)
Neutrophils Relative %: 55 % (ref 43–77)
RDW: 13.8 % (ref 11.5–15.5)

## 2013-04-19 LAB — COMPREHENSIVE METABOLIC PANEL
ALT: 14 U/L (ref 0–35)
AST: 15 U/L (ref 0–37)
Albumin: 4.3 g/dL (ref 3.5–5.2)
Alkaline Phosphatase: 47 U/L (ref 39–117)
Calcium: 9.5 mg/dL (ref 8.4–10.5)
Chloride: 105 mEq/L (ref 96–112)
Potassium: 4.2 mEq/L (ref 3.5–5.3)
Sodium: 139 mEq/L (ref 135–145)
Total Protein: 7.4 g/dL (ref 6.0–8.3)

## 2013-04-19 LAB — LIPID PANEL
Cholesterol: 144 mg/dL (ref 0–200)
HDL: 46 mg/dL
LDL Cholesterol: 86 mg/dL (ref 0–99)
Total CHOL/HDL Ratio: 3.1 ratio
Triglycerides: 58 mg/dL
VLDL: 12 mg/dL (ref 0–40)

## 2013-04-19 NOTE — Patient Instructions (Signed)
F/u 2 months  Fasting labs today, we will call with results

## 2013-04-19 NOTE — Assessment & Plan Note (Signed)
congratulated on weight loss Continue to work towards goal of normal BMI 18-25 Multivitamin, low calorie diet  Work on increasing exercise to 4 -5 days a week CHeck FLP/CMET,CBC, all normal 2012/2013 Plan to continue with phentermine for another 2 months then recheck, discussed plataeus

## 2013-04-19 NOTE — Progress Notes (Signed)
  Subjective:    Patient ID: [name], female    DOB: Dec 23, 1985, 27 y.o.   MRN: [id number]  HPI  Pt here to f/u obesity. Started phentermine 4 weeks ago, Initial weight 178lbs. Tolerating medication without any difficulty. Has lost 10lbs to date. Appetite is curved. Taking  Multivitamin some days during the week. Plans her meals, cut back to only 1 soda a day if that. Increasing water intake. Due for repeat FLP  Review of Systems   GEN- denies fatigue, fever, weight loss,weakness, recent illness HEENT- denies eye drainage, change in vision, nasal discharge, CVS- denies chest pain, palpitations RESP- denies SOB, cough, wheeze ABD- denies N/V, change in stools, abd pain GU- denies dysuria, hematuria, dribbling, incontinence MSK- denies joint pain, muscle aches, injury Neuro- denies headache, dizziness, syncope, seizure activity      Objective:   Physical Exam GEN- NAD, alert and oriented x3 HEENT- PERRL, EOMI, non injected sclera, pink conjunctiva, MMM, oropharynx clear Neck- Supple, no thyromegaly CVS- RRR, no murmur RESP-CTAB EXT- No edema Pulses- Radial2+        Assessment & Plan:

## 2013-05-05 IMAGING — US US OB COMP LESS 14 WK
1 series · 14 of 28 positions shown · non-contrast
Comparison: None.

CLINICAL DATA: Positive pregnancy test with spotting.  Tubal
ligation.

OBSTETRIC <14 WK US AND TRANSVAGINAL OB US
TECHNIQUE: Both transabdominal and transvaginal ultrasound
examinations were performed for complete evaluation of the
gestation as well as the maternal uterus, adnexal regions, and
pelvic cul-de-sac.  Transvaginal technique was performed to assess
early pregnancy.

[Series 1: us ob comp less 14 wk · 0.21mm/px · 14 of 87 slices shown]
[im 4/87]
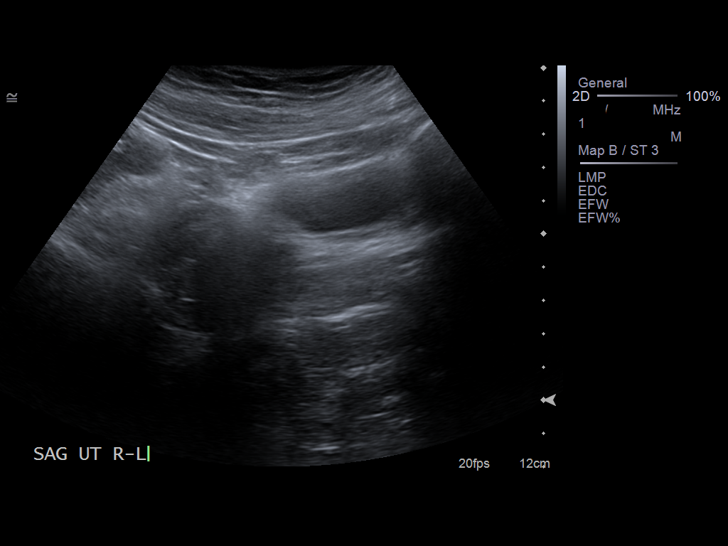
[im 10/87]
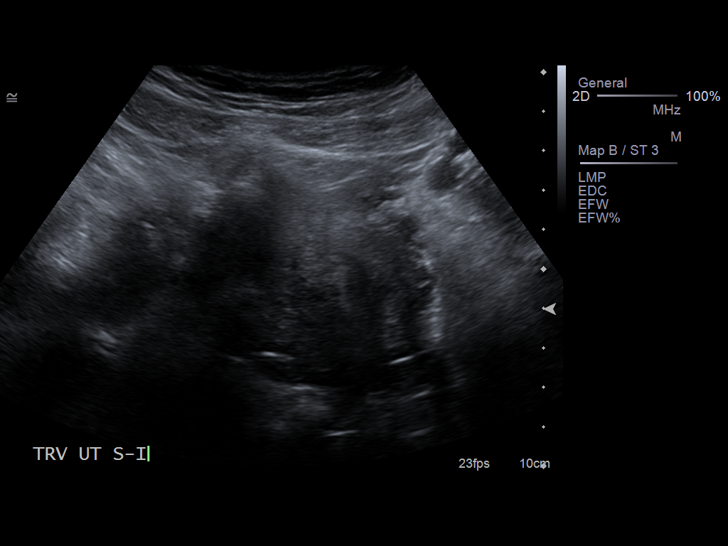
[im 16/87]
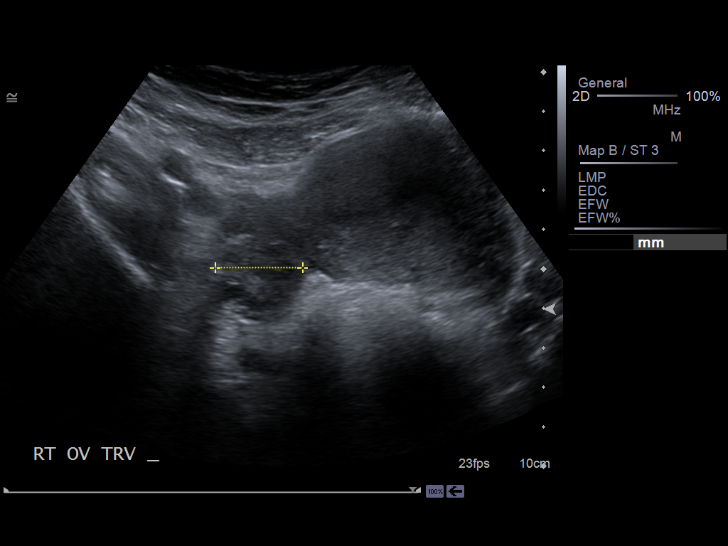
[im 23/87]
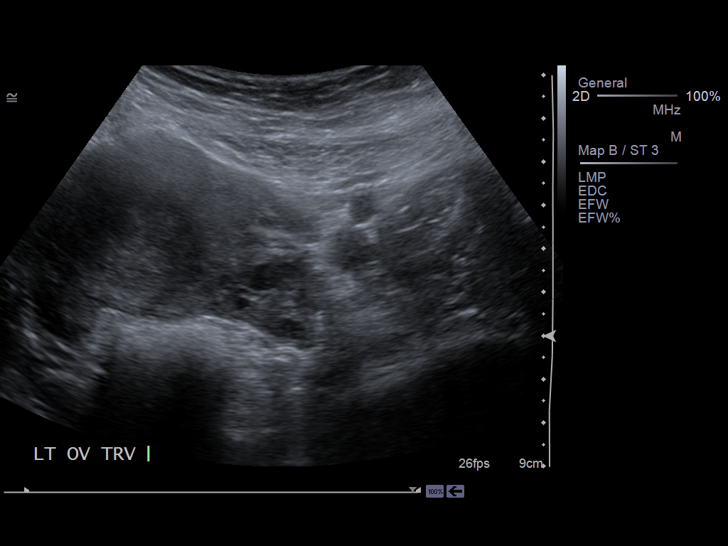
[im 29/87]
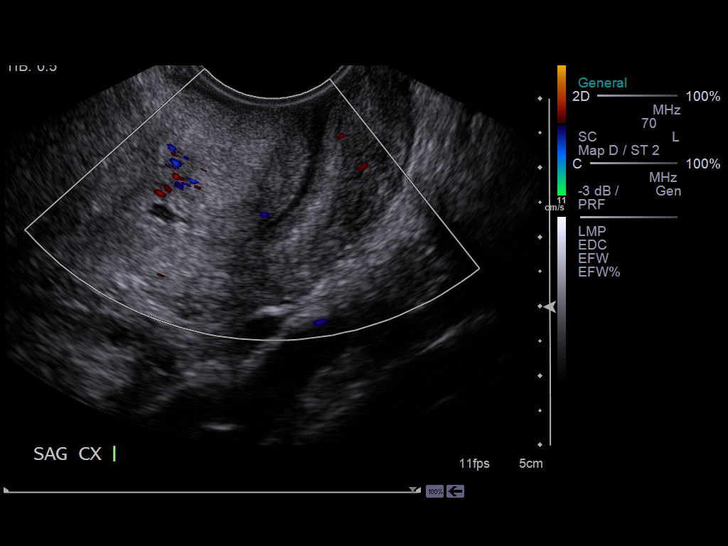
[im 36/87]
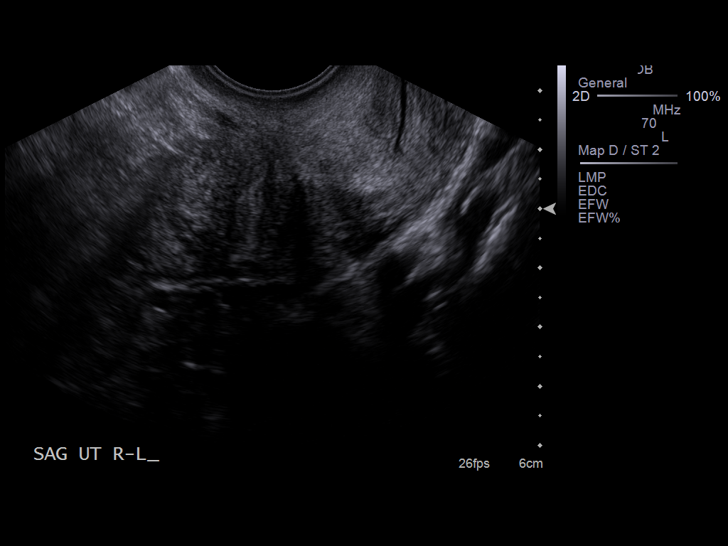
[im 42/87]
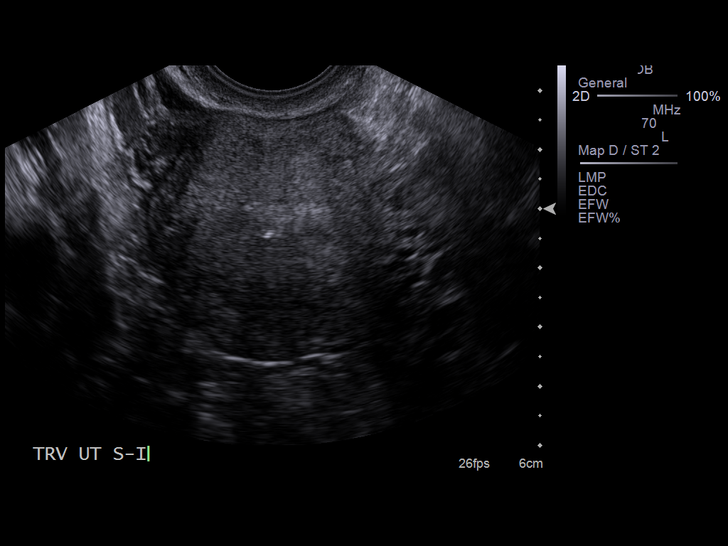
[im 48/87]
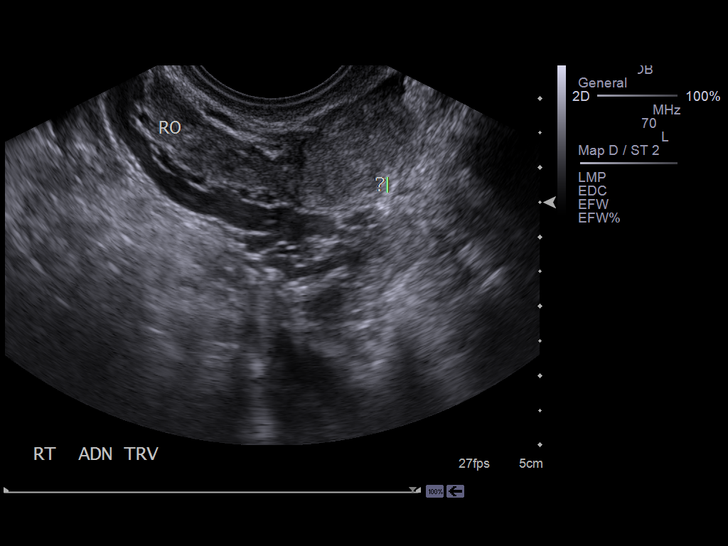
[im 55/87]
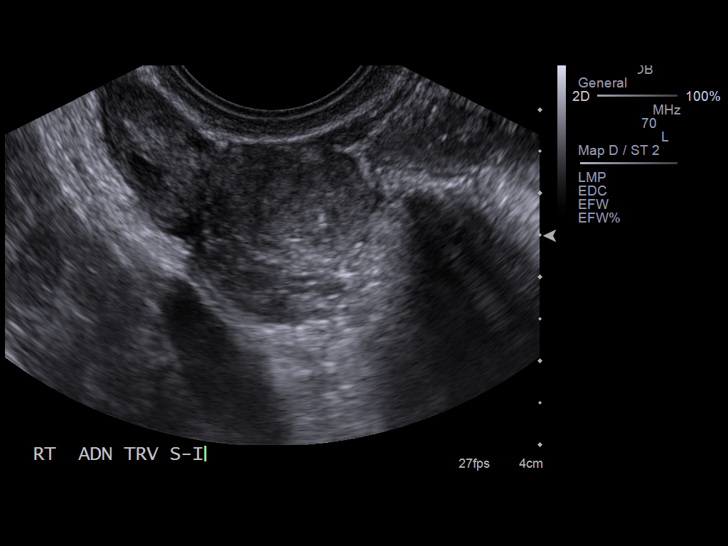
[im 61/87]
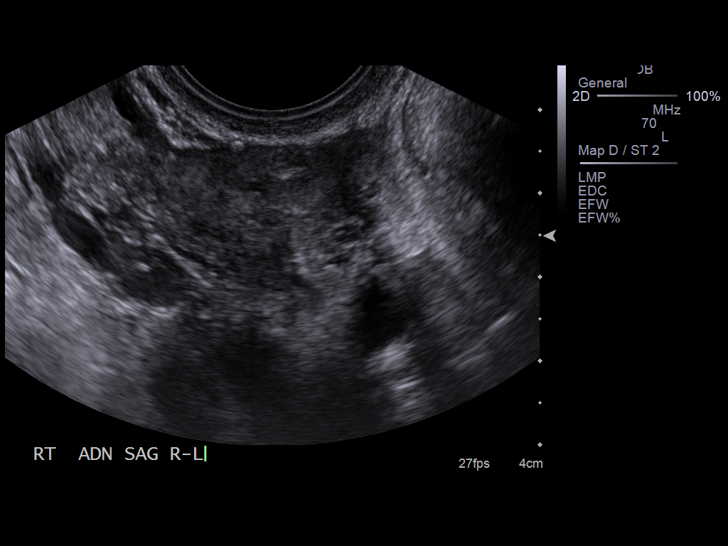
[im 67/87]
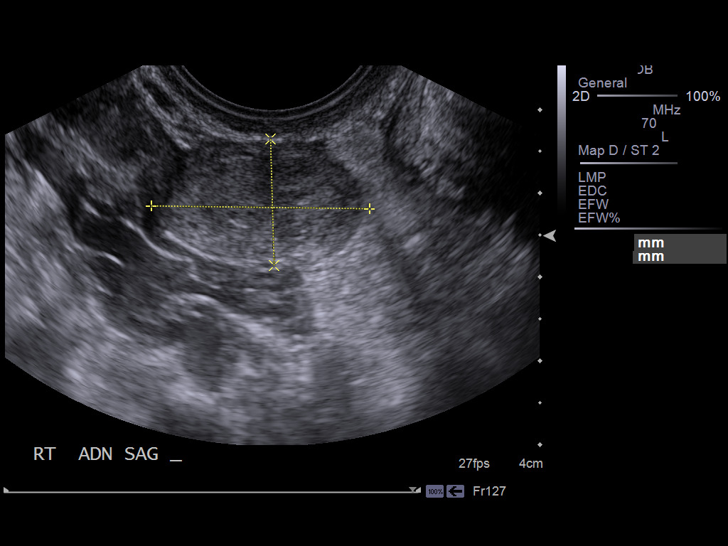
[im 74/87]
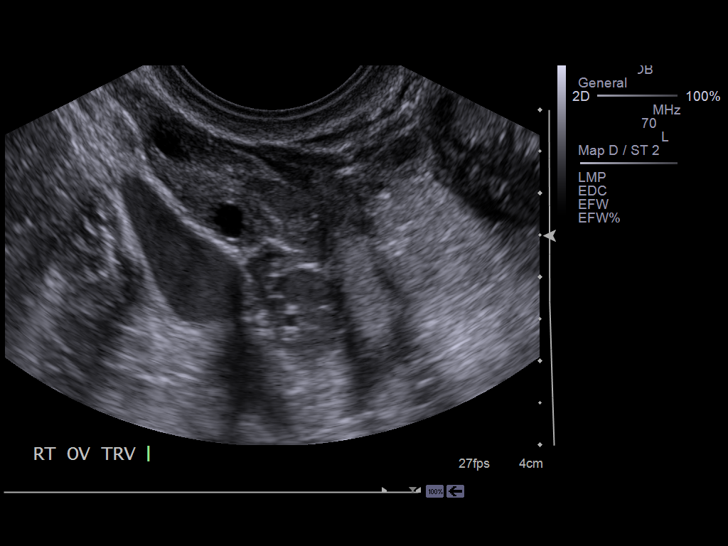
[im 80/87]
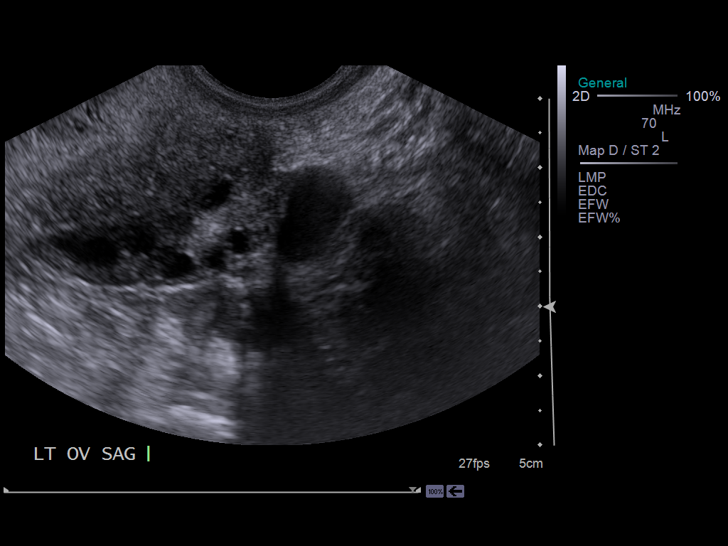
[im 87/87]
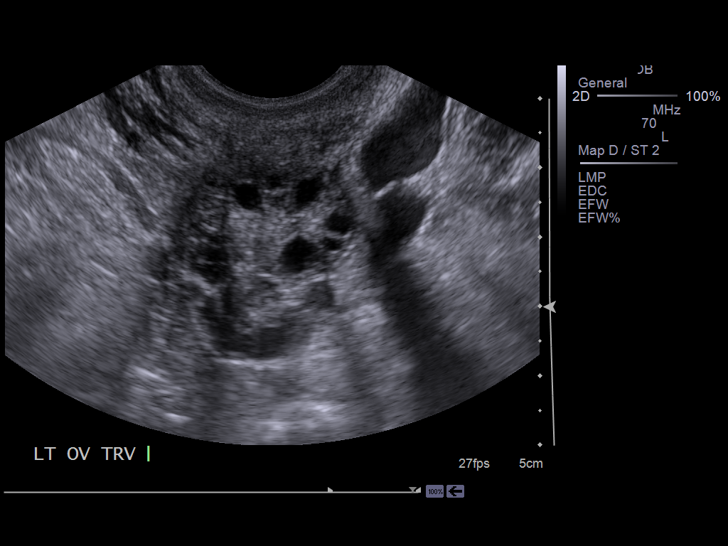

[14 of 28 positions shown; findings below may reference images not displayed]

Intrauterine gestational sac:  None.
Yolk sac: None.
Embryo: None.
Cardiac Activity: None.

Maternal uterus/adnexae:
No evidence of subchorionic hemorrhage. Small amount of
endocervical fluid.  A hypoechoic rounded structure is seen in the
right adnexa, it appears separate from the right ovary, measuring
2.6 x 1.5 x 2.2 cm.  No free fluid.
IMPRESSION: No intrauterine gestational sac. While an early IUP cannot be
excluded, a hypoechoic structure in the right adnexa is seen
adjacent to the right ovary and is worrisome for an ectopic
pregnancy. Critical Value/emergent results were called by telephone
at the time of interpretation on 01/25/2012  at 2602 hours  to  Dr.
Gaona, who verbally acknowledged these results.

## 2013-05-27 ENCOUNTER — Telehealth: Payer: Self-pay | Admitting: Family Medicine

## 2013-05-27 MED ORDER — PHENTERMINE HCL 37.5 MG PO TABS: 37.5000 mg | ORAL_TABLET | Freq: Every day | ORAL | Status: DC

## 2013-05-27 NOTE — Telephone Encounter (Signed)
Med phoned in °

## 2013-05-27 NOTE — Telephone Encounter (Signed)
Okay to refill, plus 1

## 2013-05-27 NOTE — Telephone Encounter (Signed)
Ok to refill 

## 2013-06-03 ENCOUNTER — Encounter: Payer: Self-pay | Admitting: Family Medicine

## 2013-06-03 ENCOUNTER — Ambulatory Visit (INDEPENDENT_AMBULATORY_CARE_PROVIDER_SITE_OTHER): Payer: 59 | Admitting: Family Medicine

## 2013-06-03 VITALS — BP 110/72 | HR 78 | Temp 98.3°F | Resp 16 | Ht 63.5 in | Wt 164.0 lb

## 2013-06-03 DIAGNOSIS — E669 Obesity, unspecified: Secondary | ICD-10-CM

## 2013-06-03 DIAGNOSIS — F32A Depression, unspecified: Secondary | ICD-10-CM

## 2013-06-03 DIAGNOSIS — F3289 Other specified depressive episodes: Secondary | ICD-10-CM

## 2013-06-03 DIAGNOSIS — F411 Generalized anxiety disorder: Secondary | ICD-10-CM

## 2013-06-03 DIAGNOSIS — F329 Major depressive disorder, single episode, unspecified: Secondary | ICD-10-CM

## 2013-06-03 NOTE — Patient Instructions (Signed)
I will refer to therapy at [facility] F/U 6 weeks

## 2013-06-04 DIAGNOSIS — F411 Generalized anxiety disorder: Secondary | ICD-10-CM | POA: Insufficient documentation

## 2013-06-04 NOTE — Assessment & Plan Note (Signed)
Stop phentermine for now

## 2013-06-04 NOTE — Assessment & Plan Note (Signed)
Per above, she does have low dose xanax in case needed Refer for therapy

## 2013-06-04 NOTE — Assessment & Plan Note (Addendum)
Refer to therapy  close follow-up  Declines medications at this time Contracted for safety, difficult  To tease out symptoms as her family member just passed away, she has truly started the grieving process, I think there is more anger at this time

## 2013-06-04 NOTE — Progress Notes (Signed)
  Subjective:    Patient ID: [name], female    DOB: August 16, 1986, 26 y.o.   MRN: [id number]  HPI Patient here with depression and anxiety symptoms. She continues to have difficulty in her relationship and had decided to split from her husband. She is two small children. 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 She's not getting help financially from her husband and has to pay most of the bills. It is  also  As if  she is caring for the children by herself as he will not watch them or give her time for herself. Recently her grandmother in law whom she was cared for as a nurse for the past 5 years or so passed away and she had to be the one to speak to the entire family at the nursing home. She was very close to her grandmother in law and has a great sense of loss since her death this weekend. She has alprazolam at home but has not used any of this recently. She also stopped her phentermine which she was using for weight loss. She will like to speak with the therapist to help her gain footing and declines any medications at this time. She has no thoughts of harming herself or her husband her children.   Review of Systems - per above  GEN- denies fatigue, fever, weight loss,weakness, recent illness Neuro- denies headache, dizziness, syncope, seizure activity       Objective:   Physical Exam  GEN-NAD,alert and orientefd x 3 Psych- normal affect and mood, not anxious appearing, well groonmed , good eye contact, no SI GAD 7 - 21 PHQ9- 19      Assessment & Plan:

## 2013-06-05 ENCOUNTER — Telehealth: Payer: Self-pay | Admitting: Family Medicine

## 2013-06-05 MED ORDER — ZOLPIDEM TARTRATE 5 MG PO TABS: 5.0000 mg | ORAL_TABLET | Freq: Every evening | ORAL | Status: DC | PRN

## 2013-06-05 NOTE — Telephone Encounter (Signed)
Pt called in difficulty sleeping, recent death in family see OV note Will send in [place] 5mg  prn

## 2013-06-21 ENCOUNTER — Ambulatory Visit: Payer: 59 | Admitting: Family Medicine

## 2013-07-02 ENCOUNTER — Ambulatory Visit (INDEPENDENT_AMBULATORY_CARE_PROVIDER_SITE_OTHER): Payer: 59 | Admitting: Psychiatry

## 2013-07-02 DIAGNOSIS — F4323 Adjustment disorder with mixed anxiety and depressed mood: Secondary | ICD-10-CM

## 2013-07-08 NOTE — Patient Instructions (Signed)
Discussed orally 
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
## 2013-07-22 ENCOUNTER — Telehealth: Payer: Self-pay | Admitting: Family Medicine

## 2013-07-22 MED ORDER — SERTRALINE HCL 100 MG PO TABS: 100.0000 mg | ORAL_TABLET | Freq: Every day | ORAL | Status: DC

## 2013-07-22 NOTE — Telephone Encounter (Addendum)
Pt sent EMR message: Just wanted to let know that I started back on the Zoloft. Feeling a lot better since. Will you send in refill please.   I spoke with pt she restarted 100mg  on 2 days ago, she is now seeing therapist as well, advised to decrease to 50mg  daily for next 2 weeks, then increase to 100mg  daily F/u in office 6 weeks

## 2013-08-09 ENCOUNTER — Ambulatory Visit (INDEPENDENT_AMBULATORY_CARE_PROVIDER_SITE_OTHER): Payer: 59 | Admitting: Psychiatry

## 2013-08-09 DIAGNOSIS — F4323 Adjustment disorder with mixed anxiety and depressed mood: Secondary | ICD-10-CM
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
## 2013-08-09 NOTE — Patient Instructions (Signed)
Discussed orally 

## 2013-10-21 ENCOUNTER — Encounter: Payer: Self-pay | Admitting: Family Medicine

## 2013-10-21 ENCOUNTER — Ambulatory Visit (INDEPENDENT_AMBULATORY_CARE_PROVIDER_SITE_OTHER): Payer: 59 | Admitting: Family Medicine

## 2013-10-21 VITALS — BP 100/60 | HR 82 | Temp 98.5°F | Resp 18 | Ht 63.5 in | Wt 168.0 lb

## 2013-10-21 DIAGNOSIS — F329 Major depressive disorder, single episode, unspecified: Secondary | ICD-10-CM

## 2013-10-21 DIAGNOSIS — F32A Depression, unspecified: Secondary | ICD-10-CM

## 2013-10-21 DIAGNOSIS — F3289 Other specified depressive episodes: Secondary | ICD-10-CM

## 2013-10-21 DIAGNOSIS — F411 Generalized anxiety disorder: Secondary | ICD-10-CM

## 2013-10-21 MED ORDER — SERTRALINE HCL 100 MG PO TABS: 100.0000 mg | ORAL_TABLET | Freq: Every day | ORAL | Status: DC

## 2013-10-21 MED ORDER — ZOLPIDEM TARTRATE 5 MG PO TABS: 5.0000 mg | ORAL_TABLET | Freq: Every evening | ORAL | Status: DC | PRN

## 2013-10-21 MED ORDER — SERTRALINE HCL 50 MG PO TABS: 50.0000 mg | ORAL_TABLET | Freq: Every day | ORAL | Status: DC

## 2013-10-21 NOTE — Patient Instructions (Signed)
zoloft  Increased 150 tablet daily  Ambien refilled Work on exercise routine 30 minutes- 3-4x a week Try SARNA lotion F/U 6 weeks

## 2013-10-21 NOTE — Assessment & Plan Note (Signed)
Will increase  Zoloft to 150 mg daily. We'll continue the Ambien for sleep  Followup in 6 weeks

## 2013-10-21 NOTE — Assessment & Plan Note (Signed)
Increase SSRI per above

## 2013-10-21 NOTE — Progress Notes (Signed)
   Subjective:    Patient ID: [name], female    DOB: Mar 01, 1986, 27 y.o.   MRN: [id number]  HPI  Is here to followup depression and anxiety. Also 6 weeks ago she was restarted on Zoloft she is titrated up to 100 mg. She states it helps some but she still gets depressed and down. She currently works as a Engineer, civil (consulting). She states that she gets up just because she has to but does not have a lot of interest in her work at this time. Also the weekend she typically stays at home she describes this past weekend where she stayed in her pajamas all day. She denies any suicidal ideations. States that she is stressed out right now. She is still seeing a therapist however it is been about 6 weeks since her last visit and she has rescheduled. She's also using the Ambien to help her sleep. She does note over the past year she gets intense itching in the [place] night she thinks is related to her nerves and she does not have any rash and no new contacts on her skin. When she takes her Ambien finally falls asleep itching resolves. She's only use he denies one or 2 times since her last visit. She declines a refill on this today. She also notes that she sits around the most of the day and has gained weight back. She's also not exercising like she was previously even though when she was it helped her mood. Note she's decided to work out think between her and her husband   Review of Systems  GEN- denies fatigue, fever, weight loss,weakness, recent illness HEENT- denies eye drainage, change in vision, nasal discharge, CVS- denies chest pain, palpitations RESP- denies SOB, cough, wheeze Neuro- denies headache, dizziness, syncope, seizure activity      Objective:   Physical Exam  GEN-NAD,alert and oriented x 3 Psych- normal affect and mood, no SI, not overly anxious appearing, good eye contact, well groomed      Assessment & Plan:

## 2013-11-04 ENCOUNTER — Telehealth: Payer: Self-pay | Admitting: Family Medicine

## 2013-11-04 NOTE — Telephone Encounter (Signed)
Pt is calling because her zoloft was increased from  100-150 and the pharmacy never received it  Call back number is [phone]

## 2013-11-05 ENCOUNTER — Telehealth: Payer: Self-pay | Admitting: Family Medicine

## 2013-11-05 MED ORDER — SERTRALINE HCL 100 MG PO TABS: 100.0000 mg | ORAL_TABLET | Freq: Every day | ORAL | Status: DC

## 2013-11-05 MED ORDER — SERTRALINE HCL 50 MG PO TABS: 50.0000 mg | ORAL_TABLET | Freq: Every day | ORAL | Status: DC

## 2013-11-05 NOTE — Telephone Encounter (Signed)
Need to confirm dose of Zoloft 50 mg or 100 mg?   Refills resent to pharmacy stating 150 mg dose per day as ordered by provider.

## 2013-11-05 NOTE — Telephone Encounter (Signed)
Left message with pt on voice mail that script was sent over again.

## 2013-11-11 ENCOUNTER — Telehealth: Payer: Self-pay | Admitting: *Deleted

## 2013-11-11 MED ORDER — ALPRAZOLAM 0.25 MG PO TABS: 0.2500 mg | ORAL_TABLET | Freq: Three times a day (TID) | ORAL | Status: DC | PRN

## 2013-11-11 NOTE — Telephone Encounter (Signed)
Meds refilled.

## 2013-11-29 ENCOUNTER — Ambulatory Visit (INDEPENDENT_AMBULATORY_CARE_PROVIDER_SITE_OTHER): Payer: 59 | Admitting: Psychiatry

## 2013-11-29 DIAGNOSIS — F4323 Adjustment disorder with mixed anxiety and depressed mood: Secondary | ICD-10-CM
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
## 2013-11-29 NOTE — Patient Instructions (Signed)
Discussed orally 

## 2013-12-03 ENCOUNTER — Ambulatory Visit: Payer: 59 | Admitting: Family Medicine

## 2013-12-03 ENCOUNTER — Ambulatory Visit (INDEPENDENT_AMBULATORY_CARE_PROVIDER_SITE_OTHER): Payer: 59 | Admitting: Psychiatry

## 2013-12-03 ENCOUNTER — Encounter (HOSPITAL_COMMUNITY): Payer: Self-pay | Admitting: Psychiatry

## 2013-12-03 VITALS — BP 110/80 | Ht 63.0 in | Wt 168.0 lb

## 2013-12-03 DIAGNOSIS — F32A Depression, unspecified: Secondary | ICD-10-CM

## 2013-12-03 DIAGNOSIS — F3289 Other specified depressive episodes: Secondary | ICD-10-CM

## 2013-12-03 DIAGNOSIS — F329 Major depressive disorder, single episode, unspecified: Secondary | ICD-10-CM

## 2013-12-03 MED ORDER — FLUOXETINE HCL 20 MG PO CAPS: ORAL_CAPSULE | ORAL | Status: DC
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
## 2013-12-13 ENCOUNTER — Ambulatory Visit: Payer: 59 | Admitting: Family Medicine

## 2013-12-16 ENCOUNTER — Ambulatory Visit (HOSPITAL_COMMUNITY): Payer: Self-pay | Admitting: Psychiatry

## 2013-12-27 ENCOUNTER — Ambulatory Visit (INDEPENDENT_AMBULATORY_CARE_PROVIDER_SITE_OTHER): Payer: 59 | Admitting: Psychiatry

## 2013-12-27 ENCOUNTER — Encounter (HOSPITAL_COMMUNITY): Payer: Self-pay | Admitting: Psychiatry

## 2013-12-27 VITALS — BP 130/80 | Ht 63.0 in | Wt 172.0 lb

## 2013-12-27 DIAGNOSIS — F329 Major depressive disorder, single episode, unspecified: Secondary | ICD-10-CM

## 2013-12-27 DIAGNOSIS — F3289 Other specified depressive episodes: Secondary | ICD-10-CM

## 2013-12-27 DIAGNOSIS — F32A Depression, unspecified: Secondary | ICD-10-CM
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
## 2014-01-03 ENCOUNTER — Ambulatory Visit (HOSPITAL_COMMUNITY): Payer: Self-pay | Admitting: Psychiatry

## 2014-01-14 ENCOUNTER — Telehealth: Payer: Self-pay | Admitting: Family Medicine

## 2014-01-14 MED ORDER — FEXOFENADINE-PSEUDOEPHED ER 180-240 MG PO TB24: 1.0000 | ORAL_TABLET | Freq: Every day | ORAL | Status: DC

## 2014-01-14 NOTE — Telephone Encounter (Signed)
Pt CALLED FOR REFILL ON ALLEGRA D Medication sent

## 2014-02-07 ENCOUNTER — Encounter (HOSPITAL_COMMUNITY): Payer: Self-pay | Admitting: Psychiatry

## 2014-02-07 ENCOUNTER — Ambulatory Visit (HOSPITAL_COMMUNITY): Payer: Self-pay | Admitting: Psychiatry

## 2014-02-18 ENCOUNTER — Telehealth: Payer: Self-pay | Admitting: Family Medicine

## 2014-02-18 MED ORDER — ALPRAZOLAM 0.25 MG PO TABS: 0.2500 mg | ORAL_TABLET | Freq: Three times a day (TID) | ORAL | Status: DC | PRN

## 2014-02-18 MED ORDER — PHENTERMINE HCL 37.5 MG PO TABS: 37.5000 mg | ORAL_TABLET | Freq: Every day | ORAL | Status: DC

## 2014-02-18 NOTE — Telephone Encounter (Signed)
Pt sent message via Echart Requested refill on meds- xanax and phentermine Will refill, appt scheduled for followup

## 2014-02-21 ENCOUNTER — Ambulatory Visit (INDEPENDENT_AMBULATORY_CARE_PROVIDER_SITE_OTHER): Payer: 59 | Admitting: Psychiatry

## 2014-02-21 ENCOUNTER — Encounter (HOSPITAL_COMMUNITY): Payer: Self-pay | Admitting: Psychiatry

## 2014-02-21 VITALS — BP 100/60 | Ht 63.0 in | Wt 178.0 lb

## 2014-02-21 DIAGNOSIS — F4321 Adjustment disorder with depressed mood: Secondary | ICD-10-CM

## 2014-02-21 DIAGNOSIS — F329 Major depressive disorder, single episode, unspecified: Secondary | ICD-10-CM

## 2014-02-21 DIAGNOSIS — F32A Depression, unspecified: Secondary | ICD-10-CM

## 2014-02-21 MED ORDER — FLUOXETINE HCL 20 MG PO CAPS: ORAL_CAPSULE | ORAL | Status: DC
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
## 2014-02-24 ENCOUNTER — Ambulatory Visit (INDEPENDENT_AMBULATORY_CARE_PROVIDER_SITE_OTHER): Payer: 59 | Admitting: Family Medicine

## 2014-02-24 ENCOUNTER — Encounter: Payer: Self-pay | Admitting: Family Medicine

## 2014-02-24 VITALS — BP 118/62 | HR 84 | Temp 98.4°F | Resp 18 | Ht 63.0 in | Wt 176.0 lb

## 2014-02-24 DIAGNOSIS — F3289 Other specified depressive episodes: Secondary | ICD-10-CM

## 2014-02-24 DIAGNOSIS — F329 Major depressive disorder, single episode, unspecified: Secondary | ICD-10-CM

## 2014-02-24 DIAGNOSIS — F411 Generalized anxiety disorder: Secondary | ICD-10-CM

## 2014-02-24 DIAGNOSIS — E669 Obesity, unspecified: Secondary | ICD-10-CM

## 2014-02-24 DIAGNOSIS — F32A Depression, unspecified: Secondary | ICD-10-CM

## 2014-02-24 NOTE — Assessment & Plan Note (Signed)
Continue with psychiatry and therapy

## 2014-02-24 NOTE — Progress Notes (Signed)
Patient ID: Anthoney HaradaCourtney B Pistilli, female   DOB: 07-12-1986, 28 y.o.   MRN: [id number]   Subjective:    Patient ID: Anthoney HaradaCourtney B Heuberger, female    DOB: 07-12-1986, 28 y.o.   MRN: [id number]  Patient presents for 6 week F/U and open area L arm x2  Patient here to followup chronic medical problems. She's been followed by psychiatry for depression and anxiety. She's currently on Prozac 40 mg however she takes 20 mg in the morning and approximately 3 hours later take the second 20 mg. She states she's doing well to medications. She also rarely takes Xanax. She has a prescription for Ambien but typically uses this once or twice a week. 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  Obesity-she was restarted on phentermine a week ago she's currently taking one half tablet daily. She was up to 188 pounds per report. She doesn't have the past couple days she's had some mild blurring of the vision when she takes the phentermine along with her Prozac which last a few minutes and then resolves   Skin lesions she noticed a week ago that she had some bumps a turn into blisters she does not remember being bitten by any insects no cellulitic changes. She's been using topical antibiotic cream on them  Review Of Systems:  GEN- denies fatigue, fever, weight loss,weakness, recent illness HEENT- denies eye drainage, +change in vision, nasal discharge, CVS- denies chest pain, palpitations RESP- denies SOB, cough, wheeze ABD- denies N/V, change in stools, abd pain MSK- denies joint pain, muscle aches, injury Neuro- denies headache, dizziness, syncope, seizure activity       Objective:    BP 118/62  Pulse 84  Temp(Src) 98.4 F (36.9 C) (Oral)  Resp 18  Ht 5\' 3"  (1.6 m)  Wt 176 lb (79.833 kg)  BMI 31.18 kg/m2 GEN- NAD, alert and oriented x3 HEENT-PERRL EOMI, oropharynx clear, MMM CVS- RRR, no murmur RESP-CTAB Skin- LUE- 2 scab lesions, no nodules, no fluctuance, no  erythema Psych- normal affect and mood Ext- no edema Pulses- Radial 2+        Assessment & Plan:      Problem List Items Addressed This Visit   None      Note: This dictation was prepared with Dragon dictation along with smaller phrase technology. Any transcriptional errors that result from this process are unintentional.

## 2014-02-24 NOTE — Assessment & Plan Note (Signed)
Continue current meds, advised to take fluoxetine 40 mg all at once continue Xanax as needed

## 2014-02-24 NOTE — Patient Instructions (Signed)
Continue current medications F/U 3 months  

## 2014-02-24 NOTE — Assessment & Plan Note (Signed)
She is working on improving her diet. She has decreased her carbs as well as sugary drinks. She is also trying to walk some. He is a very hectic schedule between her jobs as well as her children. We'll continue the phentermine currently at one half tablet a day discussed the blurred vision is this just recently started since she added in. She was to continue with the medication as it is not causing a lot of difficulty. Advise her to separate the SSRI and the phentermine to see if this helps if not we will have to discontinue it

## 2014-03-31 ENCOUNTER — Telehealth: Payer: Self-pay | Admitting: Family Medicine

## 2014-03-31 MED ORDER — SULFAMETHOXAZOLE-TMP DS 800-160 MG PO TABS: 1.0000 | ORAL_TABLET | Freq: Two times a day (BID) | ORAL | Status: DC

## 2014-03-31 NOTE — Telephone Encounter (Signed)
Message copied by Salley Scarlet on Mon Mar 31, 2014  2:12 PM ------      Message from: Kandis Fantasia B      Created: Mon Mar 31, 2014  8:16 AM       Good morning,             I woke up over the weekend with more blisters on my arm.  The same one, left.  This time it is 3 small fluid filled blisters.  I put a bandaid on them.  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  ------

## 2014-04-08 ENCOUNTER — Other Ambulatory Visit: Payer: Self-pay | Admitting: Family Medicine

## 2014-04-09 NOTE — Telephone Encounter (Signed)
Medication called to pharmacy. 

## 2014-04-09 NOTE — Telephone Encounter (Signed)
okay

## 2014-04-09 NOTE — Telephone Encounter (Signed)
Ok to refill??  Last office visit 02/24/2014.  Last refill 10/21/2013.

## 2014-05-20 ENCOUNTER — Other Ambulatory Visit: Payer: Self-pay | Admitting: Family Medicine

## 2014-05-21 NOTE — Telephone Encounter (Signed)
Medication called to pharmacy. 

## 2014-05-21 NOTE — Telephone Encounter (Signed)
okay

## 2014-05-21 NOTE — Telephone Encounter (Signed)
Ok to refill??  Last office visit 02/24/2014.  Last refill 04/09/2014.

## 2014-05-23 ENCOUNTER — Ambulatory Visit (HOSPITAL_COMMUNITY): Payer: Self-pay | Admitting: Psychiatry

## 2014-05-23 ENCOUNTER — Encounter (HOSPITAL_COMMUNITY): Payer: Self-pay | Admitting: Psychiatry

## 2014-07-18 ENCOUNTER — Encounter (HOSPITAL_COMMUNITY): Payer: Self-pay | Admitting: Psychiatry

## 2014-07-18 ENCOUNTER — Ambulatory Visit (INDEPENDENT_AMBULATORY_CARE_PROVIDER_SITE_OTHER): Payer: 59 | Admitting: Psychiatry

## 2014-07-18 DIAGNOSIS — F329 Major depressive disorder, single episode, unspecified: Secondary | ICD-10-CM

## 2014-07-18 DIAGNOSIS — F4321 Adjustment disorder with depressed mood: Secondary | ICD-10-CM

## 2014-07-18 DIAGNOSIS — F32A Depression, unspecified: Secondary | ICD-10-CM

## 2014-07-18 MED ORDER — FLUOXETINE HCL 40 MG PO CAPS: 40.0000 mg | ORAL_CAPSULE | Freq: Every day | ORAL | Status: DC
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
## 2014-07-28 ENCOUNTER — Other Ambulatory Visit (HOSPITAL_COMMUNITY): Payer: Self-pay | Admitting: Psychiatry

## 2014-07-28 ENCOUNTER — Telehealth (HOSPITAL_COMMUNITY): Payer: Self-pay | Admitting: *Deleted

## 2014-07-28 MED ORDER — ARIPIPRAZOLE 2 MG PO TABS: 2.0000 mg | ORAL_TABLET | Freq: Every day | ORAL | Status: DC

## 2014-07-28 NOTE — Telephone Encounter (Signed)
Abilify 2 mg daily sent

## 2014-07-28 NOTE — Telephone Encounter (Signed)
Pt saw Dr. Tenny Craw September 18th. Per pt, she had discussed with Dr. Tenny Craw in the room about calling back if she need medication for seasonal depression. Per pt would like to have medication for that. Pt pharmacy is [facility] in [place]. Pt number is [phone].

## 2014-08-28 ENCOUNTER — Other Ambulatory Visit: Payer: Self-pay | Admitting: Family Medicine

## 2014-08-28 ENCOUNTER — Encounter: Payer: Self-pay | Admitting: Family Medicine

## 2014-08-28 NOTE — Telephone Encounter (Signed)
Refill denied.   Patient discharged from practice.   [facility] provided until 07/2014.

## 2014-10-10 ENCOUNTER — Encounter (HOSPITAL_COMMUNITY): Payer: Self-pay | Admitting: Psychiatry

## 2014-10-10 ENCOUNTER — Ambulatory Visit (INDEPENDENT_AMBULATORY_CARE_PROVIDER_SITE_OTHER): Payer: 59 | Admitting: Psychiatry

## 2014-10-10 VITALS — BP 110/82 | Ht 63.0 in | Wt 192.0 lb

## 2014-10-10 DIAGNOSIS — F4321 Adjustment disorder with depressed mood: Secondary | ICD-10-CM

## 2014-10-10 DIAGNOSIS — F329 Major depressive disorder, single episode, unspecified: Secondary | ICD-10-CM

## 2014-10-10 DIAGNOSIS — F32A Depression, unspecified: Secondary | ICD-10-CM

## 2014-10-10 MED ORDER — FLUOXETINE HCL 40 MG PO CAPS: 40.0000 mg | ORAL_CAPSULE | Freq: Every day | ORAL | Status: DC
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
## 2014-10-17 ENCOUNTER — Ambulatory Visit (HOSPITAL_COMMUNITY): Payer: Self-pay | Admitting: Psychiatry

## 2014-11-20 ENCOUNTER — Telehealth (HOSPITAL_COMMUNITY): Payer: Self-pay | Admitting: *Deleted

## 2014-11-21 ENCOUNTER — Ambulatory Visit (HOSPITAL_COMMUNITY): Payer: Self-pay | Admitting: Psychiatry

## 2014-12-23 ENCOUNTER — Encounter: Payer: Self-pay | Admitting: Family Medicine

## 2014-12-29 ENCOUNTER — Encounter (HOSPITAL_COMMUNITY): Payer: Self-pay | Admitting: Psychiatry

## 2014-12-29 ENCOUNTER — Ambulatory Visit (INDEPENDENT_AMBULATORY_CARE_PROVIDER_SITE_OTHER): Payer: 59 | Admitting: Psychiatry

## 2014-12-29 VITALS — BP 120/76 | Ht 63.0 in | Wt 198.0 lb

## 2014-12-29 DIAGNOSIS — F4321 Adjustment disorder with depressed mood: Secondary | ICD-10-CM

## 2014-12-29 DIAGNOSIS — F4323 Adjustment disorder with mixed anxiety and depressed mood: Secondary | ICD-10-CM

## 2014-12-29 MED ORDER — VILAZODONE HCL 10 MG PO TABS: 10.0000 mg | ORAL_TABLET | Freq: Every day | ORAL | Status: DC
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
## 2015-01-20 ENCOUNTER — Encounter: Payer: Self-pay | Admitting: Family Medicine

## 2015-02-06 ENCOUNTER — Encounter (HOSPITAL_COMMUNITY): Payer: Self-pay | Admitting: Psychiatry

## 2015-02-06 ENCOUNTER — Ambulatory Visit (INDEPENDENT_AMBULATORY_CARE_PROVIDER_SITE_OTHER): Payer: 59 | Admitting: Psychiatry

## 2015-02-06 VITALS — BP 112/69 | HR 89 | Ht 63.0 in | Wt 202.6 lb

## 2015-02-06 DIAGNOSIS — F4323 Adjustment disorder with mixed anxiety and depressed mood: Secondary | ICD-10-CM

## 2015-02-06 MED ORDER — VILAZODONE HCL 20 MG PO TABS: 20.0000 mg | ORAL_TABLET | Freq: Every day | ORAL | Status: DC
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
## 2015-03-20 ENCOUNTER — Ambulatory Visit (HOSPITAL_COMMUNITY): Payer: Self-pay | Admitting: Psychiatry

## 2015-04-10 ENCOUNTER — Telehealth (HOSPITAL_COMMUNITY): Payer: Self-pay | Admitting: *Deleted

## 2015-04-10 ENCOUNTER — Encounter (HOSPITAL_COMMUNITY): Payer: Self-pay | Admitting: Psychiatry

## 2015-04-10 ENCOUNTER — Ambulatory Visit (INDEPENDENT_AMBULATORY_CARE_PROVIDER_SITE_OTHER): Payer: 59 | Admitting: Psychiatry

## 2015-04-10 VITALS — BP 117/71 | HR 75 | Ht 63.0 in | Wt 197.2 lb

## 2015-04-10 DIAGNOSIS — F4323 Adjustment disorder with mixed anxiety and depressed mood: Secondary | ICD-10-CM | POA: Diagnosis not present

## 2015-04-10 MED ORDER — VILAZODONE HCL 20 MG PO TABS: 20.0000 mg | ORAL_TABLET | Freq: Every day | ORAL | Status: DC
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
## 2015-04-10 NOTE — Telephone Encounter (Signed)
Opened in Error.

## 2015-04-16 ENCOUNTER — Other Ambulatory Visit (HOSPITAL_COMMUNITY): Payer: Self-pay | Admitting: Psychiatry

## 2015-04-16 MED ORDER — VILAZODONE HCL 40 MG PO TABS: 40.0000 mg | ORAL_TABLET | Freq: Every day | ORAL | Status: DC

## 2015-07-30 ENCOUNTER — Other Ambulatory Visit (HOSPITAL_COMMUNITY): Payer: Self-pay | Admitting: Psychiatry

## 2015-07-30 ENCOUNTER — Telehealth (HOSPITAL_COMMUNITY): Payer: Self-pay | Admitting: *Deleted

## 2015-07-30 MED ORDER — VILAZODONE HCL 40 MG PO TABS: 40.0000 mg | ORAL_TABLET | Freq: Every day | ORAL | Status: DC

## 2015-07-30 NOTE — Telephone Encounter (Signed)
sent 

## 2015-07-30 NOTE — Telephone Encounter (Signed)
Pt called stating she need refills for her Viibryd 40 mg QD. Pt medication was last filled 04-16-15 with 30 tabs 2 refills. Pt have an appt for 08-07-15. Pt number is [phone] or [phone]

## 2015-08-07 ENCOUNTER — Ambulatory Visit (INDEPENDENT_AMBULATORY_CARE_PROVIDER_SITE_OTHER): Payer: 59 | Admitting: Psychiatry

## 2015-08-07 ENCOUNTER — Encounter (HOSPITAL_COMMUNITY): Payer: Self-pay | Admitting: Psychiatry

## 2015-08-07 VITALS — BP 111/69 | HR 89 | Ht 63.0 in | Wt 192.0 lb

## 2015-08-07 DIAGNOSIS — F988 Other specified behavioral and emotional disorders with onset usually occurring in childhood and adolescence: Secondary | ICD-10-CM

## 2015-08-07 DIAGNOSIS — F4323 Adjustment disorder with mixed anxiety and depressed mood: Secondary | ICD-10-CM | POA: Diagnosis not present

## 2015-08-07 DIAGNOSIS — F909 Attention-deficit hyperactivity disorder, unspecified type: Secondary | ICD-10-CM | POA: Diagnosis not present

## 2015-08-07 MED ORDER — LISDEXAMFETAMINE DIMESYLATE 30 MG PO CAPS: 30.0000 mg | ORAL_CAPSULE | ORAL | Status: DC
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
## 2015-09-04 ENCOUNTER — Ambulatory Visit (INDEPENDENT_AMBULATORY_CARE_PROVIDER_SITE_OTHER): Payer: 59 | Admitting: Psychiatry

## 2015-09-04 ENCOUNTER — Encounter (HOSPITAL_COMMUNITY): Payer: Self-pay | Admitting: Psychiatry

## 2015-09-04 VITALS — BP 104/70 | HR 83 | Ht 63.0 in | Wt 187.2 lb

## 2015-09-04 DIAGNOSIS — F4323 Adjustment disorder with mixed anxiety and depressed mood: Secondary | ICD-10-CM | POA: Diagnosis not present

## 2015-09-04 MED ORDER — DULOXETINE HCL 30 MG PO CPEP: 30.0000 mg | ORAL_CAPSULE | Freq: Every day | ORAL | Status: DC

## 2015-09-04 MED ORDER — LISDEXAMFETAMINE DIMESYLATE 40 MG PO CAPS: 40.0000 mg | ORAL_CAPSULE | ORAL | Status: DC
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
## 2015-09-11 ENCOUNTER — Ambulatory Visit (HOSPITAL_COMMUNITY): Payer: Self-pay | Admitting: Psychiatry

## 2015-10-08 ENCOUNTER — Other Ambulatory Visit (HOSPITAL_COMMUNITY): Payer: Self-pay | Admitting: Psychiatry

## 2015-10-08 ENCOUNTER — Telehealth (HOSPITAL_COMMUNITY): Payer: Self-pay | Admitting: *Deleted

## 2015-10-08 ENCOUNTER — Encounter (HOSPITAL_COMMUNITY): Payer: Self-pay | Admitting: *Deleted

## 2015-10-08 MED ORDER — LISDEXAMFETAMINE DIMESYLATE 40 MG PO CAPS: 40.0000 mg | ORAL_CAPSULE | ORAL | Status: DC

## 2015-10-08 NOTE — Progress Notes (Signed)
Pt came into office to pick up her printed script that was request for her Vyvanse. Pt D/L number is [id number] with expiration date of 05-13-17. Pt agreed with printed script.

## 2015-10-08 NOTE — Telephone Encounter (Signed)
lmtcb

## 2015-10-08 NOTE — Telephone Encounter (Signed)
Vyvanse was given to pharmacy and they stapled it to the bag and she did not know it, and must have thrown it away.    She need another script please.

## 2015-10-08 NOTE — Telephone Encounter (Signed)
Please ask [name] to call pharmacy about this

## 2015-10-08 NOTE — Telephone Encounter (Signed)
Pt came into office to pick up printed script 

## 2015-10-08 NOTE — Telephone Encounter (Signed)
Pt called stating that she do not have her script for her Dec Vyvanse because her pharmacy stapled it to her script bag last month and she tossed it last month. Called pt pharmacy and spoke with Morrie Sheldonshley and she stated she did put pt December script in envelope and put in big letters to bring back to pharmacy later. Called [facility] to see if they have script and they stated no they do not.

## 2015-10-08 NOTE — Telephone Encounter (Signed)
printed

## 2015-11-06 ENCOUNTER — Encounter (HOSPITAL_COMMUNITY): Payer: Self-pay | Admitting: Psychiatry

## 2015-11-06 ENCOUNTER — Ambulatory Visit (INDEPENDENT_AMBULATORY_CARE_PROVIDER_SITE_OTHER): Payer: 59 | Admitting: Psychiatry

## 2015-11-06 VITALS — BP 115/75 | HR 80 | Ht 63.0 in | Wt 179.2 lb

## 2015-11-06 DIAGNOSIS — F988 Other specified behavioral and emotional disorders with onset usually occurring in childhood and adolescence: Secondary | ICD-10-CM

## 2015-11-06 DIAGNOSIS — F909 Attention-deficit hyperactivity disorder, unspecified type: Secondary | ICD-10-CM

## 2015-11-06 DIAGNOSIS — F4323 Adjustment disorder with mixed anxiety and depressed mood: Secondary | ICD-10-CM | POA: Diagnosis not present

## 2015-11-06 MED ORDER — LISDEXAMFETAMINE DIMESYLATE 50 MG PO CAPS: 50.0000 mg | ORAL_CAPSULE | Freq: Every day | ORAL | Status: DC

## 2015-11-06 MED ORDER — DULOXETINE HCL 60 MG PO CPEP: 60.0000 mg | ORAL_CAPSULE | Freq: Every day | ORAL | Status: DC

## 2015-11-06 MED FILL — DULoxetine HCL 60 MG CPEP: 60 | 30 days supply | Qty: 30 | Fill #0

## 2015-11-06 MED FILL — VYVANSE 50 MG CAPSULE: 50 | 30 days supply | Qty: 30 | Fill #0
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
## 2015-12-04 MED FILL — DULoxetine HCL 60 MG CPEP: 60 | 30 days supply | Qty: 30 | Fill #1

## 2015-12-04 MED FILL — VYVANSE 50 MG CAPSULE: 50 | 30 days supply | Qty: 30 | Fill #0

## 2016-01-04 MED FILL — VYVANSE 50 MG CAPSULE: 50 | 30 days supply | Qty: 30 | Fill #0

## 2016-01-05 MED FILL — DULoxetine HCL 60 MG CPEP: 60 | 30 days supply | Qty: 30 | Fill #2

## 2016-01-18 ENCOUNTER — Encounter (HOSPITAL_COMMUNITY): Payer: Self-pay | Admitting: Psychiatry

## 2016-01-18 ENCOUNTER — Ambulatory Visit (INDEPENDENT_AMBULATORY_CARE_PROVIDER_SITE_OTHER): Payer: 59 | Admitting: Psychiatry

## 2016-01-18 VITALS — BP 128/77 | HR 90 | Ht 63.0 in | Wt 178.4 lb

## 2016-01-18 DIAGNOSIS — F988 Other specified behavioral and emotional disorders with onset usually occurring in childhood and adolescence: Secondary | ICD-10-CM

## 2016-01-18 DIAGNOSIS — F4323 Adjustment disorder with mixed anxiety and depressed mood: Secondary | ICD-10-CM

## 2016-01-18 MED ORDER — LISDEXAMFETAMINE DIMESYLATE 50 MG PO CAPS: 50.0000 mg | ORAL_CAPSULE | Freq: Every day | ORAL | Status: DC

## 2016-01-18 MED ORDER — DULOXETINE HCL 60 MG PO CPEP: 60.0000 mg | ORAL_CAPSULE | Freq: Every day | ORAL | Status: DC
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
## 2016-01-21 ENCOUNTER — Ambulatory Visit (HOSPITAL_COMMUNITY): Payer: 59 | Admitting: Psychiatry

## 2016-02-01 ENCOUNTER — Telehealth (HOSPITAL_COMMUNITY): Payer: Self-pay | Admitting: *Deleted

## 2016-02-01 MED FILL — VYVANSE 50 MG CAPSULE: 50 | 30 days supply | Qty: 30 | Fill #0

## 2016-02-01 NOTE — Telephone Encounter (Signed)
phone call from patient, you and her had discussed short term med for school.  She is starting today and she would like to try that additional medication.  She think the medication was Adderall, but she is not sure.

## 2016-02-02 ENCOUNTER — Encounter (HOSPITAL_COMMUNITY): Payer: Self-pay | Admitting: *Deleted

## 2016-02-02 ENCOUNTER — Other Ambulatory Visit (HOSPITAL_COMMUNITY): Payer: Self-pay | Admitting: Psychiatry

## 2016-02-02 MED ORDER — AMPHETAMINE-DEXTROAMPHETAMINE 10 MG PO TABS: 10.0000 mg | ORAL_TABLET | Freq: Every day | ORAL | Status: DC

## 2016-02-02 NOTE — Progress Notes (Signed)
Pt came into office to pick up her printed script for her Adderall 10 mg. Pt D/ L number is [id number] with expiration date of 05-13-2017. Pt agrees with script.

## 2016-02-02 NOTE — Telephone Encounter (Signed)
Spoke with pt and she shows understanding.

## 2016-02-02 NOTE — Telephone Encounter (Signed)
Pt came into office to pick up printed script 

## 2016-02-02 NOTE — Telephone Encounter (Signed)
adderall printed

## 2016-02-05 ENCOUNTER — Ambulatory Visit (HOSPITAL_COMMUNITY): Payer: Self-pay | Admitting: Psychiatry

## 2016-02-19 ENCOUNTER — Other Ambulatory Visit: Payer: 59 | Admitting: Obstetrics & Gynecology

## 2016-02-19 ENCOUNTER — Encounter: Payer: Self-pay | Admitting: Obstetrics & Gynecology

## 2016-02-24 ENCOUNTER — Telehealth (HOSPITAL_COMMUNITY): Payer: Self-pay | Admitting: *Deleted

## 2016-02-24 ENCOUNTER — Other Ambulatory Visit (HOSPITAL_COMMUNITY): Payer: Self-pay | Admitting: Psychiatry

## 2016-02-24 MED ORDER — DULOXETINE HCL 60 MG PO CPEP: 60.0000 mg | ORAL_CAPSULE | Freq: Every day | ORAL | Status: DC

## 2016-02-24 MED FILL — DULoxetine HCL 60 MG CPEP: 60 | 90 days supply | Qty: 90 | Fill #0

## 2016-02-24 NOTE — Telephone Encounter (Signed)
Pt came into office requesting 90 days supply for her Cymbalta. Per pt, it's cheaper to get it that way. Pt number is [phone].

## 2016-02-24 NOTE — Telephone Encounter (Signed)
sent 

## 2016-02-26 NOTE — Telephone Encounter (Signed)
noted 

## 2016-03-01 MED FILL — VYVANSE 50 MG CAPSULE: 50 | 30 days supply | Qty: 30 | Fill #0

## 2016-03-02 ENCOUNTER — Other Ambulatory Visit (HOSPITAL_COMMUNITY): Payer: Self-pay | Admitting: Psychiatry

## 2016-03-02 ENCOUNTER — Telehealth (HOSPITAL_COMMUNITY): Payer: Self-pay | Admitting: *Deleted

## 2016-03-02 MED ORDER — AMPHETAMINE-DEXTROAMPHETAMINE 10 MG PO TABS: 10.0000 mg | ORAL_TABLET | Freq: Every day | ORAL | Status: DC

## 2016-03-02 NOTE — Telephone Encounter (Signed)
Pt called requesting refills for her Adderall. Pt medication last printed 02-02-2016 with 0 refill. Pt was last seen 01-18-16 and to return 04-22-16. Pt would like a call to pick up medication at her work.

## 2016-03-02 NOTE — Telephone Encounter (Signed)
noted 

## 2016-03-02 NOTE — Telephone Encounter (Signed)
printed

## 2016-03-03 ENCOUNTER — Encounter (HOSPITAL_COMMUNITY): Payer: Self-pay | Admitting: *Deleted

## 2016-03-03 NOTE — Progress Notes (Signed)
Pt came into office to pick up printed script. Pt D.L number is [id number] with expiration date of 05-13-2017. Pt printed script is Adderall 10 mg. Pt agrees with printed script.

## 2016-03-03 NOTE — Telephone Encounter (Signed)
Pt aware script is ready for pick up 

## 2016-03-30 MED FILL — VYVANSE 50 MG CAPSULE: 50 | 30 days supply | Qty: 30 | Fill #0

## 2016-04-04 ENCOUNTER — Other Ambulatory Visit (HOSPITAL_COMMUNITY): Payer: Self-pay | Admitting: Psychiatry

## 2016-04-04 ENCOUNTER — Telehealth (HOSPITAL_COMMUNITY): Payer: Self-pay | Admitting: *Deleted

## 2016-04-04 ENCOUNTER — Encounter (HOSPITAL_COMMUNITY): Payer: Self-pay | Admitting: *Deleted

## 2016-04-04 MED ORDER — AMPHETAMINE-DEXTROAMPHETAMINE 10 MG PO TABS: 10.0000 mg | ORAL_TABLET | Freq: Every day | ORAL | Status: DC

## 2016-04-04 NOTE — Telephone Encounter (Signed)
voice message from patient, she need refill of Adderall.

## 2016-04-04 NOTE — Telephone Encounter (Signed)
Called pt and informed her printed script is ready for pick up. Pt showed understanding.

## 2016-04-04 NOTE — Progress Notes (Signed)
Pt came into office to pick up printed script for her Adderall 10 mg. Pt D.L number is [id number] with expiration date of 05-13-2017. Pt agreed with printed script.

## 2016-04-04 NOTE — Telephone Encounter (Signed)
printed

## 2016-04-06 DIAGNOSIS — H5213 Myopia, bilateral: Secondary | ICD-10-CM | POA: Diagnosis not present

## 2016-04-22 ENCOUNTER — Ambulatory Visit (HOSPITAL_COMMUNITY): Payer: Self-pay | Admitting: Psychiatry

## 2016-04-29 ENCOUNTER — Ambulatory Visit (INDEPENDENT_AMBULATORY_CARE_PROVIDER_SITE_OTHER): Payer: 59 | Admitting: Psychiatry

## 2016-04-29 ENCOUNTER — Encounter (HOSPITAL_COMMUNITY): Payer: Self-pay | Admitting: Psychiatry

## 2016-04-29 VITALS — BP 116/79 | HR 76 | Ht 63.0 in | Wt 172.4 lb

## 2016-04-29 DIAGNOSIS — F988 Other specified behavioral and emotional disorders with onset usually occurring in childhood and adolescence: Secondary | ICD-10-CM

## 2016-04-29 DIAGNOSIS — F4323 Adjustment disorder with mixed anxiety and depressed mood: Secondary | ICD-10-CM | POA: Diagnosis not present

## 2016-04-29 DIAGNOSIS — F909 Attention-deficit hyperactivity disorder, unspecified type: Secondary | ICD-10-CM | POA: Diagnosis not present

## 2016-04-29 MED ORDER — AMPHETAMINE-DEXTROAMPHETAMINE 10 MG PO TABS: 10.0000 mg | ORAL_TABLET | Freq: Every day | ORAL | Status: DC

## 2016-04-29 MED ORDER — LISDEXAMFETAMINE DIMESYLATE 60 MG PO CAPS: 60.0000 mg | ORAL_CAPSULE | ORAL | Status: DC

## 2016-04-29 MED ORDER — DULOXETINE HCL 60 MG PO CPEP: 60.0000 mg | ORAL_CAPSULE | Freq: Every day | ORAL | Status: DC
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
## 2016-05-05 MED FILL — VYVANSE 60 MG CAPSULE: 60 | 30 days supply | Qty: 30 | Fill #0

## 2016-06-02 MED FILL — VYVANSE 60 MG CAPSULE: 60 | 30 days supply | Qty: 30 | Fill #0

## 2016-07-06 MED FILL — DEXTROAMP-AMP 10 MG TAB: 10 | 30 days supply | Qty: 30 | Fill #0

## 2016-07-06 MED FILL — VYVANSE 60 MG CAPSULE: 60 | 30 days supply | Qty: 30 | Fill #0

## 2016-07-28 ENCOUNTER — Ambulatory Visit (HOSPITAL_COMMUNITY): Payer: Self-pay | Admitting: Psychiatry

## 2016-08-05 ENCOUNTER — Encounter (HOSPITAL_COMMUNITY): Payer: Self-pay | Admitting: Psychiatry

## 2016-08-05 ENCOUNTER — Ambulatory Visit (INDEPENDENT_AMBULATORY_CARE_PROVIDER_SITE_OTHER): Payer: 59 | Admitting: Psychiatry

## 2016-08-05 VITALS — BP 123/70 | HR 95 | Ht 63.0 in | Wt 170.0 lb

## 2016-08-05 DIAGNOSIS — F4323 Adjustment disorder with mixed anxiety and depressed mood: Secondary | ICD-10-CM | POA: Diagnosis not present

## 2016-08-05 DIAGNOSIS — F900 Attention-deficit hyperactivity disorder, predominantly inattentive type: Secondary | ICD-10-CM | POA: Diagnosis not present

## 2016-08-05 MED ORDER — LISDEXAMFETAMINE DIMESYLATE 60 MG PO CAPS: 60.0000 mg | ORAL_CAPSULE | ORAL | 0 refills | Status: DC

## 2016-08-05 MED ORDER — AMPHETAMINE-DEXTROAMPHETAMINE 10 MG PO TABS: 10.0000 mg | ORAL_TABLET | Freq: Every day | ORAL | 0 refills | Status: DC

## 2016-08-05 MED ORDER — DULOXETINE HCL 60 MG PO CPEP: 60.0000 mg | ORAL_CAPSULE | Freq: Every day | ORAL | 2 refills | Status: DC

## 2016-08-05 MED FILL — DULoxetine HCL 60 MG CPEP: 60 | 90 days supply | Qty: 90 | Fill #0
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
## 2016-08-12 MED FILL — VYVANSE 60 MG CAPSULE: 60 | 30 days supply | Qty: 30 | Fill #0

## 2016-08-12 MED FILL — AMPHETAMINE SALTS 10 MG TAB: 10 | 30 days supply | Qty: 30 | Fill #0

## 2016-09-20 MED FILL — VYVANSE 60 MG CAPSULE: 60 | 30 days supply | Qty: 30 | Fill #0

## 2016-10-18 MED FILL — VYVANSE 60 MG CAPSULE: 60 | 30 days supply | Qty: 30 | Fill #0

## 2016-11-04 ENCOUNTER — Encounter (HOSPITAL_COMMUNITY): Payer: Self-pay | Admitting: Psychiatry

## 2016-11-04 ENCOUNTER — Ambulatory Visit (INDEPENDENT_AMBULATORY_CARE_PROVIDER_SITE_OTHER): Payer: 59 | Admitting: Psychiatry

## 2016-11-04 VITALS — BP 121/80 | HR 86 | Ht 63.0 in | Wt 161.2 lb

## 2016-11-04 DIAGNOSIS — Z8261 Family history of arthritis: Secondary | ICD-10-CM

## 2016-11-04 DIAGNOSIS — Z8249 Family history of ischemic heart disease and other diseases of the circulatory system: Secondary | ICD-10-CM

## 2016-11-04 DIAGNOSIS — Z825 Family history of asthma and other chronic lower respiratory diseases: Secondary | ICD-10-CM

## 2016-11-04 DIAGNOSIS — Z801 Family history of malignant neoplasm of trachea, bronchus and lung: Secondary | ICD-10-CM

## 2016-11-04 DIAGNOSIS — F900 Attention-deficit hyperactivity disorder, predominantly inattentive type: Secondary | ICD-10-CM | POA: Diagnosis not present

## 2016-11-04 DIAGNOSIS — F4323 Adjustment disorder with mixed anxiety and depressed mood: Secondary | ICD-10-CM

## 2016-11-04 DIAGNOSIS — Z8041 Family history of malignant neoplasm of ovary: Secondary | ICD-10-CM

## 2016-11-04 DIAGNOSIS — Z79899 Other long term (current) drug therapy: Secondary | ICD-10-CM

## 2016-11-04 MED ORDER — LISDEXAMFETAMINE DIMESYLATE 60 MG PO CAPS: 60.0000 mg | ORAL_CAPSULE | ORAL | 0 refills | Status: DC

## 2016-11-04 MED ORDER — AMPHETAMINE-DEXTROAMPHETAMINE 10 MG PO TABS: 10.0000 mg | ORAL_TABLET | Freq: Every day | ORAL | 0 refills | Status: DC

## 2016-11-04 MED FILL — VYVANSE 60 MG CAPSULE: 60 | 30 days supply | Qty: 30 | Fill #0

## 2016-11-04 MED FILL — DEXTROAMP-AMP 10 MG TAB: 10 | 30 days supply | Qty: 30 | Fill #0
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
## 2016-12-12 ENCOUNTER — Telehealth (HOSPITAL_COMMUNITY): Payer: Self-pay | Admitting: *Deleted

## 2016-12-12 NOTE — Telephone Encounter (Signed)
phone call from patient, regarding the scripts for Vyvanse.  When she went to [facility], they said they do not have the scripts for Vyvanse.   She need new scripts for Feb and March.

## 2016-12-13 ENCOUNTER — Encounter (HOSPITAL_COMMUNITY): Payer: Self-pay | Admitting: *Deleted

## 2016-12-13 ENCOUNTER — Other Ambulatory Visit (HOSPITAL_COMMUNITY): Payer: Self-pay | Admitting: Psychiatry

## 2016-12-13 MED ORDER — LISDEXAMFETAMINE DIMESYLATE 60 MG PO CAPS: 60.0000 mg | ORAL_CAPSULE | ORAL | 0 refills | Status: DC

## 2016-12-13 NOTE — Telephone Encounter (Signed)
lmtcb

## 2016-12-13 NOTE — Telephone Encounter (Signed)
I reprinted scripts that were given at last visit. Tell her not to lose or misplace them

## 2016-12-13 NOTE — Telephone Encounter (Signed)
phone call from patient, regarding the scripts for Vyvanse.  When she went to [facility], they said they do not have the scripts for Vyvanse.   She need new scripts for Feb and March. Pt was informed to return in 3 months from Jan. 2018.

## 2016-12-13 NOTE — Progress Notes (Signed)
Pt D/L number is [id number] with exp 05-13-17. Script iD number is [id number], and order id  [id number]. Vyvase.

## 2016-12-14 MED FILL — VYVANSE 60 MG CAPSULE: 60 | 30 days supply | Qty: 30 | Fill #0

## 2016-12-14 NOTE — Telephone Encounter (Signed)
Pt picked up printed script. Per pt she did take her script to [facility] and they are the one that lost her printed script.

## 2016-12-30 ENCOUNTER — Other Ambulatory Visit (HOSPITAL_COMMUNITY): Payer: Self-pay | Admitting: Psychiatry

## 2016-12-30 ENCOUNTER — Telehealth (HOSPITAL_COMMUNITY): Payer: Self-pay | Admitting: *Deleted

## 2016-12-30 MED ORDER — AMPHETAMINE-DEXTROAMPHETAMINE 10 MG PO TABS: 10.0000 mg | ORAL_TABLET | Freq: Every day | ORAL | 0 refills | Status: DC

## 2016-12-30 NOTE — Telephone Encounter (Signed)
printed

## 2016-12-30 NOTE — Telephone Encounter (Signed)
voice message from patient, she need refill on Adderall.

## 2017-01-02 NOTE — Telephone Encounter (Signed)
Called [name] and informed her that her Printed medication she request for Adderall is ready for pick up. [name] verbalized understanding and stated she will come by office to pick up script around 1:30 pm today.

## 2017-01-02 NOTE — Telephone Encounter (Signed)
Medication management - Adderall prescription picked up this date.

## 2017-01-03 MED FILL — DEXTROAMP-AMPHETAMIN 10 MG: 10 | 30 days supply | Qty: 30 | Fill #0

## 2017-01-17 MED FILL — VYVANSE 60 MG CAPSULE: 60 | 30 days supply | Qty: 30 | Fill #0

## 2017-02-03 ENCOUNTER — Ambulatory Visit (INDEPENDENT_AMBULATORY_CARE_PROVIDER_SITE_OTHER): Payer: 59 | Admitting: Psychiatry

## 2017-02-03 ENCOUNTER — Encounter (HOSPITAL_COMMUNITY): Payer: Self-pay | Admitting: Psychiatry

## 2017-02-03 VITALS — BP 111/78 | Wt 152.4 lb

## 2017-02-03 DIAGNOSIS — F4323 Adjustment disorder with mixed anxiety and depressed mood: Secondary | ICD-10-CM | POA: Diagnosis not present

## 2017-02-03 DIAGNOSIS — F900 Attention-deficit hyperactivity disorder, predominantly inattentive type: Secondary | ICD-10-CM

## 2017-02-03 DIAGNOSIS — Z818 Family history of other mental and behavioral disorders: Secondary | ICD-10-CM | POA: Diagnosis not present

## 2017-02-03 DIAGNOSIS — Z79899 Other long term (current) drug therapy: Secondary | ICD-10-CM | POA: Diagnosis not present

## 2017-02-03 MED ORDER — AMPHETAMINE-DEXTROAMPHETAMINE 10 MG PO TABS: 10.0000 mg | ORAL_TABLET | Freq: Every day | ORAL | 0 refills | Status: DC

## 2017-02-03 MED ORDER — LISDEXAMFETAMINE DIMESYLATE 60 MG PO CAPS: 60.0000 mg | ORAL_CAPSULE | ORAL | 0 refills | Status: DC
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
## 2017-02-14 MED FILL — AMPHETAMINE SALTS 10 MG TAB: 10 | 30 days supply | Qty: 30 | Fill #0

## 2017-02-14 MED FILL — VYVANSE 60 MG CAPSULE: 60 | 30 days supply | Qty: 30 | Fill #0

## 2017-03-16 MED FILL — DEXTROAMP-AMP 10 MG TAB: 10 | 30 days supply | Qty: 30 | Fill #0

## 2017-03-16 MED FILL — VYVANSE 60 MG CAPSULE: 60 | 30 days supply | Qty: 30 | Fill #0

## 2017-04-13 ENCOUNTER — Telehealth (HOSPITAL_COMMUNITY): Payer: Self-pay | Admitting: *Deleted

## 2017-04-13 NOTE — Telephone Encounter (Signed)
left voice message regarding provider out of office 04/27/17.

## 2017-04-19 MED FILL — VYVANSE 60 MG CAPSULE: 60 | 30 days supply | Qty: 30 | Fill #0

## 2017-04-27 ENCOUNTER — Ambulatory Visit (HOSPITAL_COMMUNITY): Payer: Self-pay | Admitting: Psychiatry

## 2017-05-10 ENCOUNTER — Ambulatory Visit (HOSPITAL_COMMUNITY): Payer: Self-pay | Admitting: Psychiatry

## 2017-05-11 ENCOUNTER — Encounter (HOSPITAL_COMMUNITY): Payer: Self-pay | Admitting: Psychiatry

## 2017-05-11 ENCOUNTER — Ambulatory Visit (INDEPENDENT_AMBULATORY_CARE_PROVIDER_SITE_OTHER): Payer: 59 | Admitting: Psychiatry

## 2017-05-11 VITALS — BP 125/74 | HR 88 | Ht 63.0 in | Wt 141.0 lb

## 2017-05-11 DIAGNOSIS — F4323 Adjustment disorder with mixed anxiety and depressed mood: Secondary | ICD-10-CM

## 2017-05-11 DIAGNOSIS — F900 Attention-deficit hyperactivity disorder, predominantly inattentive type: Secondary | ICD-10-CM

## 2017-05-11 MED ORDER — LISDEXAMFETAMINE DIMESYLATE 50 MG PO CAPS: 50.0000 mg | ORAL_CAPSULE | Freq: Every day | ORAL | 0 refills | Status: DC

## 2017-05-11 MED ORDER — AMPHETAMINE-DEXTROAMPHETAMINE 10 MG PO TABS: 10.0000 mg | ORAL_TABLET | Freq: Every day | ORAL | 0 refills | Status: DC
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
## 2017-05-22 MED FILL — DEXTROAMP-AMP 10 MG TAB: 10 | 30 days supply | Qty: 30 | Fill #0

## 2017-06-16 MED FILL — VYVANSE 50 MG CAPSULE: 50 | 30 days supply | Qty: 30 | Fill #0

## 2017-06-20 MED FILL — AMPHETAMINE SALTS 10 MG TAB: 10 | 30 days supply | Qty: 30 | Fill #0

## 2017-07-12 DIAGNOSIS — R634 Abnormal weight loss: Secondary | ICD-10-CM | POA: Diagnosis not present

## 2017-07-12 DIAGNOSIS — Z6823 Body mass index (BMI) 23.0-23.9, adult: Secondary | ICD-10-CM | POA: Diagnosis not present

## 2017-07-12 DIAGNOSIS — F900 Attention-deficit hyperactivity disorder, predominantly inattentive type: Secondary | ICD-10-CM | POA: Diagnosis not present

## 2017-07-20 MED FILL — VYVANSE 50 MG CAPSULE: 50 | 30 days supply | Qty: 30 | Fill #0

## 2017-08-04 DIAGNOSIS — Z23 Encounter for immunization: Secondary | ICD-10-CM | POA: Diagnosis not present

## 2017-08-10 ENCOUNTER — Encounter (HOSPITAL_COMMUNITY): Payer: Self-pay | Admitting: Psychiatry

## 2017-08-10 ENCOUNTER — Ambulatory Visit (INDEPENDENT_AMBULATORY_CARE_PROVIDER_SITE_OTHER): Payer: 59 | Admitting: Psychiatry

## 2017-08-10 VITALS — BP 117/80 | HR 75 | Ht 63.0 in | Wt 134.2 lb

## 2017-08-10 DIAGNOSIS — Z79899 Other long term (current) drug therapy: Secondary | ICD-10-CM

## 2017-08-10 DIAGNOSIS — Z630 Problems in relationship with spouse or partner: Secondary | ICD-10-CM | POA: Diagnosis not present

## 2017-08-10 DIAGNOSIS — F4321 Adjustment disorder with depressed mood: Secondary | ICD-10-CM | POA: Diagnosis not present

## 2017-08-10 DIAGNOSIS — F900 Attention-deficit hyperactivity disorder, predominantly inattentive type: Secondary | ICD-10-CM

## 2017-08-10 DIAGNOSIS — G479 Sleep disorder, unspecified: Secondary | ICD-10-CM | POA: Diagnosis not present

## 2017-08-10 MED ORDER — LISDEXAMFETAMINE DIMESYLATE 20 MG PO CAPS: 20.0000 mg | ORAL_CAPSULE | Freq: Every day | ORAL | 0 refills | Status: DC

## 2017-08-10 MED ORDER — ATOMOXETINE HCL 40 MG PO CAPS: ORAL_CAPSULE | ORAL | 2 refills | Status: DC

## 2017-08-10 MED FILL — ATOMOXETINE HCL 40 MG CAP: 40 | 30 days supply | Qty: 60 | Fill #0
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
## 2017-08-15 MED FILL — VYVANSE 20 MG CAPSULE: 20 | 30 days supply | Qty: 30 | Fill #0

## 2017-09-07 ENCOUNTER — Ambulatory Visit (HOSPITAL_COMMUNITY): Payer: Self-pay | Admitting: Psychiatry

## 2017-09-15 ENCOUNTER — Encounter (HOSPITAL_COMMUNITY): Payer: Self-pay | Admitting: Psychiatry

## 2017-09-15 ENCOUNTER — Ambulatory Visit (HOSPITAL_COMMUNITY): Payer: 59 | Admitting: Psychiatry

## 2017-09-15 VITALS — BP 118/82 | HR 100 | Ht 63.0 in | Wt 137.0 lb

## 2017-09-15 DIAGNOSIS — Z818 Family history of other mental and behavioral disorders: Secondary | ICD-10-CM

## 2017-09-15 DIAGNOSIS — Z79899 Other long term (current) drug therapy: Secondary | ICD-10-CM | POA: Diagnosis not present

## 2017-09-15 DIAGNOSIS — F900 Attention-deficit hyperactivity disorder, predominantly inattentive type: Secondary | ICD-10-CM

## 2017-09-15 DIAGNOSIS — Z8659 Personal history of other mental and behavioral disorders: Secondary | ICD-10-CM | POA: Diagnosis not present

## 2017-09-15 DIAGNOSIS — Z87891 Personal history of nicotine dependence: Secondary | ICD-10-CM | POA: Diagnosis not present

## 2017-09-15 MED ORDER — LISDEXAMFETAMINE DIMESYLATE 20 MG PO CAPS: 20.0000 mg | ORAL_CAPSULE | Freq: Every day | ORAL | 0 refills | Status: DC

## 2017-09-15 MED ORDER — ATOMOXETINE HCL 80 MG PO CAPS: 80.0000 mg | ORAL_CAPSULE | Freq: Every day | ORAL | 2 refills | Status: DC

## 2017-09-15 NOTE — Progress Notes (Signed)
BH MD/PA/NP OP Progress Note  09/15/2017 11:39 AM Anthoney HaradaCourtney B Hulgan  MRN:  [id number]  Chief Complaint:  Chief Complaint    ADD; Follow-up     HPI:  this patient is a 31 year-old separated black female lives with her  2 girls ages 765 and 647 in [place]. 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  The patient was initially referred by Dr. Jeanice Limurham, her primary provider for treatment of depression and anxiety.  She also has a history of ADHD, inattentive type she is seeing Florencia Reasonseggy Bynum  a therapist here who has referred her to me.  She returns after 4 weeks.  She is no longer having any symptoms of depression and anxiety now that she and her husband are separated and she does not have to deal with concerns about him.  She is enjoying her new job.  Last time she was wanting to get off Vyvanse at the high dose because she had lost so much weight.  She was switched to Strattera and is up to 80 mg.  She is focusing fairly well most of the time.  She does have a little bit of nausea with it but takes it with food.  She is taking it at night.  Sometimes she uses the Vyvanse 20 mg with it when she has to work but she is hoping over time she will not need it.   Visit Diagnosis:    ICD-10-CM   1. Attention deficit hyperactivity disorder (ADHD), predominantly inattentive type F90.0     Past Psychiatric History: none  Past Medical History:  Past Medical History:  Diagnosis Date  . Bell's palsy   . Bell's palsy     Past Surgical History:  Procedure Laterality Date  . BILATERAL SALPINGECTOMY  2013   Due to tubal pregnancy  . CESAREAN SECTION  x 2  . DILATATION & CURETTAGE/HYSTEROSCOPY WITH THERMACHOICE ABLATION N/A 02/17/2012   Performed by Tilda BurrowFerguson, John V, MD at [facility]  . LAPAROSCOPIC BILATERAL SALPINGECTOMY Bilateral 02/17/2012   Performed by Tilda BurrowFerguson, John V, MD at [facility]  . TONSILLECTOMY  1991  . TUBAL LIGATION    . WISDOM TOOTH EXTRACTION      Family Psychiatric History: See  below  Family History:  Family History  Problem Relation Age of Onset  . Heart disease Unknown   . Arthritis Unknown   . Lung disease Unknown   . Cancer Unknown   . Asthma Unknown   . Depression Mother   . Hypertension Mother   . Asthma Mother   . Hypertension Father   . Cancer Maternal Grandmother        ovarian   . Cancer Paternal Grandfather        lung   . Asthma Brother   . Anesthesia problems Neg Hx   . Hypotension Neg Hx   . Malignant hyperthermia Neg Hx   . Pseudochol deficiency Neg Hx     Social History:  Social History   Socioeconomic History  . Marital status: Married    Spouse name: None  . Number of children: None  . Years of education: college  . Highest education level: None  Social Needs  . Financial resource strain: None  . Food insecurity - worry: None  . Food insecurity - inability: None  . Transportation needs - medical: None  . Transportation needs - non-medical: None  Occupational History  . Occupation: Public relations account executivelpn    Employer: [employer]  Tobacco Use  .  Smoking status: Former Smoker    Packs/day: 0.25    Years: 3.00    Pack years: 0.75    Types: Cigarettes    Last attempt to quit: 02/09/2002    Years since quitting: 15.6  . Smokeless tobacco: Never Used  Substance and Sexual Activity  . Alcohol use: Yes    Comment: occasionally  . Drug use: No  . Sexual activity: Yes    Birth control/protection: Surgical  Other Topics Concern  . None  Social History Narrative  . None    Allergies:  Allergies  Allergen Reactions  . Percocet [Oxycodone-Acetaminophen] Itching  . Wellbutrin [Bupropion] Hives    Hallucinations     Metabolic Disorder Labs: No results found for: HGBA1C, MPG No results found for: PROLACTIN Lab Results  Component Value Date   CHOL 144 04/19/2013   TRIG 58 04/19/2013   HDL 46 04/19/2013   CHOLHDL 3.1 04/19/2013   VLDL 12 04/19/2013   LDLCALC 86 04/19/2013   LDLCALC 64 04/26/2011   No results found for:  TSH  Therapeutic Level Labs: No results found for: LITHIUM No results found for: VALPROATE No components found for:  CBMZ  Current Medications: Current Outpatient Medications  Medication Sig Dispense Refill  . lisdexamfetamine (VYVANSE) 20 MG capsule Take 1 capsule (20 mg total) daily by mouth. 30 capsule 0  . atomoxetine (STRATTERA) 80 MG capsule Take 1 capsule (80 mg total) daily by mouth. 30 capsule 2  . lisdexamfetamine (VYVANSE) 20 MG capsule Take 1 capsule (20 mg total) daily by mouth. 30 capsule 0  . lisdexamfetamine (VYVANSE) 20 MG capsule Take 1 capsule (20 mg total) daily by mouth. 30 capsule 0   No current facility-administered medications for this visit.      Musculoskeletal: Strength & Muscle Tone: within normal limits Gait & Station: normal Patient leans: N/A  Psychiatric Specialty Exam: Review of Systems  All other systems reviewed and are negative.   Blood pressure 118/82, pulse 100, height 5\' 3"  (1.6 m), weight 137 lb (62.1 kg), SpO2 99 %.Body mass index is 24.27 kg/m.  General Appearance: Casual, Neat and Well Groomed  Eye Contact:  Good  Speech:  Clear and Coherent  Volume:  Normal  Mood:  Euthymic  Affect:  Congruent  Thought Process:  Goal Directed  Orientation:  Full (Time, Place, and Person)  Thought Content: WDL   Suicidal Thoughts:  No  Homicidal Thoughts:  No  Memory:  Immediate;   Good Recent;   Good Remote;   Good  Judgement:  Good  Insight:  Good  Psychomotor Activity:  Normal  Concentration:  Concentration: Good and Attention Span: Good with medication  Recall:  Good  Fund of Knowledge: Good  Language: Good  Akathisia:  No  Handed:  Right  AIMS (if indicated): not done  Assets:  Communication Skills Desire for Improvement Leisure Time Physical Health Resilience Social Support Talents/Skills Vocational/Educational  ADL's:  Intact  Cognition: WNL  Sleep:  Good   Screenings:   Assessment and Plan: 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  She is currently being treated for ADHD, inattentive type and is doing well on her current regimen.  She will continue Strattera 80 mg daily as well as Vyvanse 20 mg daily as needed.  She will return to see me in 3 months   Diannia Rudereborah Dajane Valli, MD 09/15/2017, 11:39 AM

## 2017-09-20 DIAGNOSIS — H52201 Unspecified astigmatism, right eye: Secondary | ICD-10-CM | POA: Diagnosis not present

## 2017-09-20 DIAGNOSIS — H5213 Myopia, bilateral: Secondary | ICD-10-CM | POA: Diagnosis not present

## 2017-09-26 ENCOUNTER — Telehealth (HOSPITAL_COMMUNITY): Payer: Self-pay | Admitting: *Deleted

## 2017-09-26 NOTE — Telephone Encounter (Deleted)
Dr Posey Prontooss James's father Dannielle HuhDanny called in stating that he's getting weekly emails & reports from school  stating that Fayrene FearingJames isn't staying focused  & isn't doing his tasks.  He's concerned about the Adderall & wonders if you would increase the dosage?

## 2017-09-26 NOTE — Telephone Encounter (Signed)
Dr Caleb Popposs Laketha stated that when she was here in the office & you were e-scribing a pop up window appeared & you told her to let her know if Pharmacy received the Rx's. [facility] did receive the Strattera but not the Vyvanse. [facility]a not the correct pharmacy. [facility]. She stated that she still requires the refills on those 2 medications.

## 2017-09-27 ENCOUNTER — Other Ambulatory Visit (HOSPITAL_COMMUNITY): Payer: Self-pay | Admitting: Psychiatry

## 2017-09-27 MED ORDER — LISDEXAMFETAMINE DIMESYLATE 20 MG PO CAPS: 20.0000 mg | ORAL_CAPSULE | Freq: Every day | ORAL | 0 refills | Status: DC

## 2017-09-27 MED ORDER — ATOMOXETINE HCL 80 MG PO CAPS: 80.0000 mg | ORAL_CAPSULE | Freq: Every day | ORAL | 2 refills | Status: DC

## 2017-09-27 MED FILL — ATOMOXETINE HCL 80 MG CAPS: 80 | 30 days supply | Qty: 30 | Fill #0

## 2017-09-27 NOTE — Telephone Encounter (Signed)
Called LVM per Dr. Eli Phillipsoss strattera sent, she can pick up vyvanse

## 2017-09-27 NOTE — Telephone Encounter (Signed)
strattera sent, she can pick up vyvanse

## 2017-09-29 MED FILL — VYVANSE 20 MG CAPSULE: 20 | 30 days supply | Qty: 30 | Fill #0

## 2017-10-27 MED FILL — VYVANSE 20 MG CAPSULE: 20 | 30 days supply | Qty: 30 | Fill #0

## 2017-11-27 MED FILL — VYVANSE 20 MG CAPSULE: 20 | 30 days supply | Qty: 30 | Fill #0

## 2017-12-27 MED FILL — VYVANSE 20 MG CAPSULE: 20 | 30 days supply | Qty: 30 | Fill #0

## 2018-01-10 DIAGNOSIS — Z6825 Body mass index (BMI) 25.0-25.9, adult: Secondary | ICD-10-CM | POA: Diagnosis not present

## 2018-01-10 DIAGNOSIS — J019 Acute sinusitis, unspecified: Secondary | ICD-10-CM | POA: Diagnosis not present

## 2018-01-10 DIAGNOSIS — R05 Cough: Secondary | ICD-10-CM | POA: Diagnosis not present

## 2018-01-17 ENCOUNTER — Encounter (HOSPITAL_COMMUNITY): Payer: Self-pay | Admitting: Psychiatry

## 2018-01-17 ENCOUNTER — Ambulatory Visit (HOSPITAL_COMMUNITY): Payer: 59 | Admitting: Psychiatry

## 2018-01-17 VITALS — BP 112/77 | HR 74 | Ht 63.0 in | Wt 147.0 lb

## 2018-01-17 DIAGNOSIS — Z818 Family history of other mental and behavioral disorders: Secondary | ICD-10-CM

## 2018-01-17 DIAGNOSIS — F900 Attention-deficit hyperactivity disorder, predominantly inattentive type: Secondary | ICD-10-CM

## 2018-01-17 DIAGNOSIS — Z87891 Personal history of nicotine dependence: Secondary | ICD-10-CM

## 2018-01-17 MED ORDER — LISDEXAMFETAMINE DIMESYLATE 30 MG PO CAPS: 30.0000 mg | ORAL_CAPSULE | ORAL | 0 refills | Status: DC

## 2018-01-17 MED ORDER — LISDEXAMFETAMINE DIMESYLATE 30 MG PO CAPS
30.0000 mg | ORAL_CAPSULE | ORAL | 0 refills | Status: DC
Start: 2018-01-17 — End: 2018-05-10

## 2018-01-17 MED FILL — VYVANSE 30 MG CAPSULE: 30 | 30 days supply | Qty: 30 | Fill #0

## 2018-01-17 NOTE — Progress Notes (Signed)
BH MD/PA/NP OP Progress Note  01/17/2018 3:30 PM Anthoney HaradaCourtney B Orsak  MRN:  [id number]  Chief Complaint:  Chief Complaint    ADD; Follow-up     HPI: this patient is a 32 year-old separated black female lives with her 2 girls ages 765 and 517 in [place]. 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  The patient was initially referred by Dr. Jeanice Limurham, her primary provider for treatment of depression and anxiety.  She also has a history of ADHD, inattentive type she is seeing Florencia Reasonseggy Bynum a therapist here who has referred her to me.  The patient returns after 4 months.  She states that since I last saw her her ex-husband has now remarried his current girlfriend.  This was a bit of an adjustment for her and her daughters.  At first she was a bit depressed but she seems to have pulled out of it just fine.  Her girls only see him every other weekend.  She is no longer in school but is trying to focus on her children and her job.  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  She stopped the Strattera because it was causing stomach issues and does not think the Vyvanse 20 mg is quite enough to get through her day.  She used to be on 70 mg but this was too high and she lost a lot of weight.  I suggested we try 30 mg to get her through her workday and she thinks this will be good   Visit Diagnosis:    ICD-10-CM   1. Attention deficit hyperactivity disorder (ADHD), predominantly inattentive type F90.0     Past Psychiatric History: none  Past Medical History:  Past Medical History:  Diagnosis Date  . Bell's palsy   . Bell's palsy     Past Surgical History:  Procedure Laterality Date  . BILATERAL SALPINGECTOMY  2013   Due to tubal pregnancy  . CESAREAN SECTION  x 2  . TONSILLECTOMY  1991  . TUBAL LIGATION    . WISDOM TOOTH EXTRACTION      Family Psychiatric History: See below  Family History:  Family History  Problem Relation Age of Onset  . Heart disease Unknown    . Arthritis Unknown   . Lung disease Unknown   . Cancer Unknown   . Asthma Unknown   . Depression Mother   . Hypertension Mother   . Asthma Mother   . Hypertension Father   . Cancer Maternal Grandmother        ovarian   . Cancer Paternal Grandfather        lung   . Asthma Brother   . Anesthesia problems Neg Hx   . Hypotension Neg Hx   . Malignant hyperthermia Neg Hx   . Pseudochol deficiency Neg Hx     Social History:  Social History   Socioeconomic History  . Marital status: Married    Spouse name: None  . Number of children: None  . Years of education: college  . Highest education level: None  Social Needs  . Financial resource strain: None  . Food insecurity - worry: None  . Food insecurity - inability: None  . Transportation needs - medical: None  . Transportation needs - non-medical: None  Occupational History  . Occupation: Public relations account executivelpn    Employer: [employer]  Tobacco Use  . Smoking status: Former Smoker    Packs/day: 0.25    Years:  3.00    Pack years: 0.75    Types: Cigarettes    Last attempt to quit: 02/09/2002    Years since quitting: 15.9  . Smokeless tobacco: Never Used  Substance and Sexual Activity  . Alcohol use: Yes    Comment: occasionally  . Drug use: No  . Sexual activity: Yes    Birth control/protection: Surgical  Other Topics Concern  . None  Social History Narrative  . None    Allergies:  Allergies  Allergen Reactions  . Percocet [Oxycodone-Acetaminophen] Itching  . Wellbutrin [Bupropion] Hives    Hallucinations     Metabolic Disorder Labs: No results found for: HGBA1C, MPG No results found for: PROLACTIN Lab Results  Component Value Date   CHOL 144 04/19/2013   TRIG 58 04/19/2013   HDL 46 04/19/2013   CHOLHDL 3.1 04/19/2013   VLDL 12 04/19/2013   LDLCALC 86 04/19/2013   LDLCALC 64 04/26/2011   No results found for: TSH  Therapeutic Level Labs: No results found for: LITHIUM No results found for: VALPROATE No components  found for:  CBMZ  Current Medications: Current Outpatient Medications  Medication Sig Dispense Refill  . lisdexamfetamine (VYVANSE) 30 MG capsule Take 1 capsule (30 mg total) by mouth every morning. 30 capsule 0  . lisdexamfetamine (VYVANSE) 30 MG capsule Take 1 capsule (30 mg total) by mouth every morning. 30 capsule 0  . lisdexamfetamine (VYVANSE) 30 MG capsule Take 1 capsule (30 mg total) by mouth every morning. 30 capsule 0   No current facility-administered medications for this visit.      Musculoskeletal: Strength & Muscle Tone: within normal limits Gait & Station: normal Patient leans: N/A  Psychiatric Specialty Exam: Review of Systems  All other systems reviewed and are negative.   Blood pressure 112/77, pulse 74, height 5\' 3"  (1.6 m), weight 147 lb (66.7 kg), SpO2 98 %.Body mass index is 26.04 kg/m.  General Appearance: Casual, Neat and Well Groomed  Eye Contact:  Good  Speech:  Clear and Coherent  Volume:  Normal  Mood:  Euthymic  Affect:  Appropriate  Thought Process:  Goal Directed  Orientation:  Full (Time, Place, and Person)  Thought Content: WDL   Suicidal Thoughts:  No  Homicidal Thoughts:  No  Memory:  Immediate;   Good Recent;   Good Remote;   Good  Judgement:  Good  Insight:  Good  Psychomotor Activity:  Normal  Concentration:  Concentration: Fair and Attention Span: Fair  Recall:  Good  Fund of Knowledge: Good  Language: Good  Akathisia:  No  Handed:  Right  AIMS (if indicated): not done  Assets:  Communication Skills Desire for Improvement Physical Health Resilience Social Support Talents/Skills Vocational/Educational  ADL's:  Intact  Cognition: WNL  Sleep:  Good   Screenings:   Assessment and Plan: This patient is a 32 year old female with a history of ADD.  She is no longer depressed or anxious.  She thinks the Vyvanse works fairly well but probably needs a slightly higher dosage.  She will go up to Vyvanse 30 mg every morning and  return to see me in 3 months   Diannia Ruder, MD 01/17/2018, 3:30 PM

## 2018-02-15 MED FILL — VYVANSE 30 MG CAPSULE: 30 | 30 days supply | Qty: 30 | Fill #0

## 2018-03-19 MED FILL — VYVANSE 30 MG CAPSULE: 30 | 30 days supply | Qty: 30 | Fill #0

## 2018-04-12 DIAGNOSIS — N3001 Acute cystitis with hematuria: Secondary | ICD-10-CM | POA: Diagnosis not present

## 2018-04-12 DIAGNOSIS — Z6825 Body mass index (BMI) 25.0-25.9, adult: Secondary | ICD-10-CM | POA: Diagnosis not present

## 2018-04-18 ENCOUNTER — Other Ambulatory Visit (HOSPITAL_COMMUNITY): Payer: Self-pay | Admitting: Psychiatry

## 2018-04-19 ENCOUNTER — Ambulatory Visit (HOSPITAL_COMMUNITY): Payer: 59 | Admitting: Psychiatry

## 2018-04-19 MED FILL — VYVANSE 30 MG CAPSULE: 30 | 30 days supply | Qty: 30 | Fill #0

## 2018-05-10 ENCOUNTER — Ambulatory Visit (HOSPITAL_COMMUNITY): Payer: 59 | Admitting: Psychiatry

## 2018-05-10 ENCOUNTER — Encounter (HOSPITAL_COMMUNITY): Payer: Self-pay | Admitting: Psychiatry

## 2018-05-10 VITALS — BP 113/77 | HR 87 | Ht 63.0 in | Wt 152.0 lb

## 2018-05-10 DIAGNOSIS — F900 Attention-deficit hyperactivity disorder, predominantly inattentive type: Secondary | ICD-10-CM | POA: Diagnosis not present

## 2018-05-10 MED ORDER — LISDEXAMFETAMINE DIMESYLATE 40 MG PO CAPS: 40.0000 mg | ORAL_CAPSULE | ORAL | 0 refills | Status: DC

## 2018-05-10 NOTE — Progress Notes (Signed)
BH MD/PA/NP OP Progress Note  05/10/2018 4:15 PM [name]  MRN:  [id number]  Chief Complaint:  Chief Complaint    ADHD; Follow-up     HPI: This patient is a 32 year old divorced black female who lives with her 2 daughters ages 52 and 63 in [place].  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  She is on Vyvanse 30 mg every morning.  She states that her workplace is extremely busy and she is very pressed through the day. she often feels like she cannot quite keep up.  She denies being depressed but is slightly anxious about her workload.  She thinks her Vyvanse may need to go up just a little bit. Visit Diagnosis:    ICD-10-CM   1. Attention deficit hyperactivity disorder (ADHD), predominantly inattentive type F90.0     Past Psychiatric History: none  Past Medical History:  Past Medical History:  Diagnosis Date  . Bell's palsy   . Bell's palsy     Past Surgical History:  Procedure Laterality Date  . BILATERAL SALPINGECTOMY  2013   Due to tubal pregnancy  . CESAREAN SECTION  x 2  . TONSILLECTOMY  1991  . TUBAL LIGATION    . WISDOM TOOTH EXTRACTION      Family Psychiatric History: none  Family History:  Family History  Problem Relation Age of Onset  . Heart disease Unknown   . Arthritis Unknown   . Lung disease Unknown   . Cancer Unknown   . Asthma Unknown   . Depression Mother   . Hypertension Mother   . Asthma Mother   . Hypertension Father   . Cancer Maternal Grandmother        ovarian   . Cancer Paternal Grandfather        lung   . Asthma Brother   . Anesthesia problems Neg Hx   . Hypotension Neg Hx   . Malignant hyperthermia Neg Hx   . Pseudochol deficiency Neg Hx     Social History:  Social History   Socioeconomic History  . Marital status: Married    Spouse name: Not on file  . Number of children: Not on file  . Years of education: college   . Highest education level: Not on file  Occupational History  . Occupation: Public relations account executive: [employer]  Social Needs  . Financial resource strain: Not on file  . Food insecurity:    Worry: Not on file    Inability: Not on file  . Transportation needs:    Medical: Not on file    Non-medical: Not on file  Tobacco Use  . Smoking status: Former Smoker    Packs/day: 0.25    Years: 3.00    Pack years: 0.75    Types: Cigarettes    Last attempt to quit: 02/09/2002    Years since quitting: 16.2  . Smokeless tobacco: Never Used  Substance and Sexual Activity  . Alcohol use: Yes    Comment: occasionally  . Drug use: No  . Sexual activity: Yes    Birth control/protection: Surgical  Lifestyle  . Physical activity:    Days per week: Not on file    Minutes per session: Not on file  . Stress: Not on file  Relationships  . Social connections:    Talks on phone: Not on file    Gets together:  Not on file    Attends religious service: Not on file    Active member of club or organization: Not on file    Attends meetings of clubs or organizations: Not on file    Relationship status: Not on file  Other Topics Concern  . Not on file  Social History Narrative  . Not on file    Allergies:  Allergies  Allergen Reactions  . Percocet [Oxycodone-Acetaminophen] Itching  . Wellbutrin [Bupropion] Hives    Hallucinations     Metabolic Disorder Labs: No results found for: HGBA1C, MPG No results found for: PROLACTIN Lab Results  Component Value Date   CHOL 144 04/19/2013   TRIG 58 04/19/2013   HDL 46 04/19/2013   CHOLHDL 3.1 04/19/2013   VLDL 12 04/19/2013   LDLCALC 86 04/19/2013   LDLCALC 64 04/26/2011   No results found for: TSH  Therapeutic Level Labs: No results found for: LITHIUM No results found for: VALPROATE No components found for:  CBMZ  Current Medications: Current Outpatient Medications  Medication Sig Dispense Refill  . lisdexamfetamine (VYVANSE) 40 MG capsule  Take 1 capsule (40 mg total) by mouth every morning. 30 capsule 0  . lisdexamfetamine (VYVANSE) 40 MG capsule Take 1 capsule (40 mg total) by mouth every morning. 30 capsule 0  . lisdexamfetamine (VYVANSE) 40 MG capsule Take 1 capsule (40 mg total) by mouth every morning. 30 capsule 0   No current facility-administered medications for this visit.      Musculoskeletal: Strength & Muscle Tone: within normal limits Gait & Station: normal Patient leans: N/A  Psychiatric Specialty Exam: Review of Systems  All other systems reviewed and are negative.   Blood pressure 113/77, pulse 87, height 5\' 3"  (1.6 m), weight 152 lb (68.9 kg), SpO2 99 %.Body mass index is 26.93 kg/m.  General Appearance: Casual, Neat and Well Groomed  Eye Contact:  Good  Speech:  Clear and Coherent  Volume:  Normal  Mood:  Euthymic  Affect:  Congruent  Thought Process:  Goal Directed  Orientation:  Full (Time, Place, and Person)  Thought Content: WDL   Suicidal Thoughts:  No  Homicidal Thoughts:  No  Memory:  Immediate;   Good Recent;   Good Remote;   Good  Judgement:  Good  Insight:  Good  Psychomotor Activity:  Normal  Concentration:  Concentration: Fair and Attention Span: Fair  Recall:  Good  Fund of Knowledge: Good  Language: Good  Akathisia:  No  Handed:  Right  AIMS (if indicated): not done  Assets:  Communication Skills Desire for Improvement Physical Health Resilience Social Support Talents/Skills  ADL's:  Intact  Cognition: WNL  Sleep:  Good   Screenings:   Assessment and Plan: This patient is a 32 year old female with a history of ADD.  She feels like the medication is not quite enough for her.  We will increase Vyvanse to 40 mg every morning.  She is having no current side effects.  She will return to see me in 3 months   Diannia Rudereborah Serena Petterson, MD 05/10/2018, 4:15 PM

## 2018-05-11 MED FILL — VYVANSE 40 MG CAPSULE: 40 | 30 days supply | Qty: 30 | Fill #0

## 2018-06-11 MED FILL — VYVANSE 40 MG CAPSULE: 40 | 30 days supply | Qty: 30 | Fill #0

## 2018-07-04 DIAGNOSIS — Z7689 Persons encountering health services in other specified circumstances: Secondary | ICD-10-CM | POA: Diagnosis not present

## 2018-07-04 DIAGNOSIS — Z6825 Body mass index (BMI) 25.0-25.9, adult: Secondary | ICD-10-CM | POA: Diagnosis not present

## 2018-07-04 DIAGNOSIS — N760 Acute vaginitis: Secondary | ICD-10-CM | POA: Diagnosis not present

## 2018-07-04 DIAGNOSIS — Z209 Contact with and (suspected) exposure to unspecified communicable disease: Secondary | ICD-10-CM | POA: Diagnosis not present

## 2018-07-10 ENCOUNTER — Encounter (HOSPITAL_COMMUNITY): Payer: Self-pay | Admitting: Emergency Medicine

## 2018-07-10 ENCOUNTER — Other Ambulatory Visit: Payer: Self-pay

## 2018-07-10 ENCOUNTER — Emergency Department (HOSPITAL_COMMUNITY)
Admission: EM | Admit: 2018-07-10 | Discharge: 2018-07-10 | Discharge status: Against Medical Advice | Payer: 59 | Attending: Emergency Medicine | Admitting: Emergency Medicine

## 2018-07-10 DIAGNOSIS — Z5321 Procedure and treatment not carried out due to patient leaving prior to being seen by health care provider: Secondary | ICD-10-CM | POA: Diagnosis not present

## 2018-07-10 DIAGNOSIS — R509 Fever, unspecified: Secondary | ICD-10-CM | POA: Insufficient documentation

## 2018-07-10 LAB — COMPREHENSIVE METABOLIC PANEL
ALT: 20 U/L (ref 0–44)
AST: 23 U/L (ref 15–41)
Albumin: 4.5 g/dL (ref 3.5–5.0)
Alkaline Phosphatase: 46 U/L (ref 38–126)
Anion gap: 9 (ref 5–15)
BUN: 8 mg/dL (ref 6–20)
CHLORIDE: 103 mmol/L (ref 98–111)
CO2: 25 mmol/L (ref 22–32)
CREATININE: 0.58 mg/dL (ref 0.44–1.00)
Calcium: 9.2 mg/dL (ref 8.9–10.3)
GFR calc Af Amer: >60 mL/min (ref >60)
GFR calc non Af Amer: >60 mL/min (ref >60)
Glucose, Bld: 90 mg/dL (ref 70–99)
POTASSIUM: 3.8 mmol/L (ref 3.5–5.1)
SODIUM: 137 mmol/L (ref 135–145)
Total Bilirubin: 0.3 mg/dL (ref 0.3–1.2)
Total Protein: 8.3 g/dL — ABNORMAL HIGH (ref 6.5–8.1)

## 2018-07-10 LAB — CBC WITH DIFFERENTIAL/PLATELET
BASOS PCT: 1 %
Basophils Absolute: 0 10*3/uL (ref 0.0–0.1)
Eosinophils Absolute: 0.2 10*3/uL (ref 0.0–0.7)
Eosinophils Relative: 3 %
HCT: 41.4 % (ref 36.0–46.0)
Hemoglobin: 14 g/dL (ref 12.0–15.0)
Lymphocytes Relative: 31 %
Lymphs Abs: 2.2 10*3/uL (ref 0.7–4.0)
MCH: 29.9 pg (ref 26.0–34.0)
MCHC: 33.8 g/dL (ref 30.0–36.0)
MCV: 88.3 fL (ref 78.0–100.0)
Monocytes Absolute: 0.5 10*3/uL (ref 0.1–1.0)
Monocytes Relative: 7 %
Neutro Abs: 4.1 10*3/uL (ref 1.7–7.7)
Neutrophils Relative %: 58 %
Platelets: 309 10*3/uL (ref 150–400)
RBC: 4.69 MIL/uL (ref 3.87–5.11)
RDW: 12.7 % (ref 11.5–15.5)
WBC: 7.1 10*3/uL (ref 4.0–10.5)

## 2018-07-10 NOTE — ED Triage Notes (Signed)
PT states she has been on an antibiotic and antiviral for possible Bacterial Vaginosis per her PCP. PT states she continues to have fever of 101.0 without tylenol over the past few days and some burning with urination.

## 2018-07-10 NOTE — ED Notes (Signed)
Pt not in waiting room when called for rooming.

## 2018-07-11 MED FILL — VYVANSE 40 MG CAPSULE: 40 | 30 days supply | Qty: 30 | Fill #0

## 2018-07-17 DIAGNOSIS — B029 Zoster without complications: Secondary | ICD-10-CM | POA: Diagnosis not present

## 2018-08-06 ENCOUNTER — Other Ambulatory Visit (HOSPITAL_COMMUNITY): Payer: Self-pay | Admitting: Psychiatry

## 2018-08-06 MED ORDER — LISDEXAMFETAMINE DIMESYLATE 40 MG PO CAPS: 40.0000 mg | ORAL_CAPSULE | ORAL | 0 refills | Status: DC

## 2018-08-08 MED FILL — VYVANSE 40 MG CAPSULE: 40 | 30 days supply | Qty: 30 | Fill #0

## 2018-08-13 ENCOUNTER — Encounter (HOSPITAL_COMMUNITY): Payer: Self-pay | Admitting: Psychiatry

## 2018-08-13 ENCOUNTER — Ambulatory Visit (HOSPITAL_COMMUNITY): Payer: 59 | Admitting: Psychiatry

## 2018-08-13 VITALS — BP 134/88 | HR 79 | Ht 63.0 in | Wt 150.0 lb

## 2018-08-13 DIAGNOSIS — F900 Attention-deficit hyperactivity disorder, predominantly inattentive type: Secondary | ICD-10-CM | POA: Diagnosis not present

## 2018-08-13 DIAGNOSIS — Z87891 Personal history of nicotine dependence: Secondary | ICD-10-CM

## 2018-08-13 MED ORDER — AMPHETAMINE-DEXTROAMPHETAMINE 10 MG PO TABS: ORAL_TABLET | ORAL | 0 refills | Status: DC

## 2018-08-13 MED ORDER — LISDEXAMFETAMINE DIMESYLATE 40 MG PO CAPS: 40.0000 mg | ORAL_CAPSULE | ORAL | 0 refills | Status: DC

## 2018-08-13 MED FILL — AMPHETAMINE-DEXTROAMPHETAMI: 10 | 30 days supply | Qty: 30 | Fill #0

## 2018-08-13 NOTE — Progress Notes (Signed)
[facility] MD/PA/NP OP Progress Note  08/13/2018 4:03 PM ANWITHA MAPES  MRN:  [id number]  Chief Complaint:  Chief Complaint    ADD; Follow-up     HPI: This patient is a 32 year old divorced black female who lives with HER-2 daughters in [place].  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  The patient returns after 3 months for follow-up on her ADD symptoms.  She is now on Vyvanse 40 mg daily and it seems to be working fairly well.  However some days she has to stay late and it seems to be wearing off around 3:00.  In the past she took some low-dose Adderall after the Vyvanse wore off and this seemed to work fairly well for her.  She is willing to try this again.  She is eating better and has regained some of the weight she lost. Visit Diagnosis:    ICD-10-CM   1. Attention deficit hyperactivity disorder (ADHD), predominantly inattentive type F90.0 lisdexamfetamine (VYVANSE) 40 MG capsule    Past Psychiatric History: none  Past Medical History:  Past Medical History:  Diagnosis Date  . Bell's palsy   . Bell's palsy     Past Surgical History:  Procedure Laterality Date  . BILATERAL SALPINGECTOMY  2013   Due to tubal pregnancy  . CESAREAN SECTION  x 2  . TONSILLECTOMY  1991  . TUBAL LIGATION    . WISDOM TOOTH EXTRACTION      Family Psychiatric History: See below  Family History:  Family History  Problem Relation Age of Onset  . Depression Mother   . Hypertension Mother   . Asthma Mother   . Hypertension Father   . Cancer Maternal Grandmother        ovarian   . Cancer Paternal Grandfather        lung   . Asthma Brother   . Heart disease Unknown   . Arthritis Unknown   . Lung disease Unknown   . Cancer Unknown   . Asthma Unknown   . Anesthesia problems Neg Hx   . Hypotension Neg Hx   . Malignant hyperthermia Neg Hx   . Pseudochol deficiency Neg Hx     Social History:  Social History   Socioeconomic History  . Marital status:  Married    Spouse name: Not on file  . Number of children: Not on file  . Years of education: college  . Highest education level: Not on file  Occupational History  . Occupation: Economist: [employer]  . Financial resource strain: Not on file  . Food insecurity:    Worry: Not on file    Inability: Not on file  . Transportation needs:    Medical: Not on file    Non-medical: Not on file  Tobacco Use  . Smoking status: Former Smoker    Packs/day: 0.25    Years: 3.00    Pack years: 0.75    Types: Cigarettes    Last attempt to quit: 02/09/2002    Years since quitting: 16.5  . Smokeless tobacco: Never Used  Substance and Sexual Activity  . Alcohol use: Yes    Comment: occasionally  . Drug use: No  . Sexual activity: Yes    Birth control/protection: Surgical  Lifestyle  . Physical activity:    Days per week: Not on file    Minutes per session: Not on file  . Stress: Not on file  Relationships  .  Social connections:    Talks on phone: Not on file    Gets together: Not on file    Attends religious service: Not on file    Active member of club or organization: Not on file    Attends meetings of clubs or organizations: Not on file    Relationship status: Not on file  Other Topics Concern  . Not on file  Social History Narrative  . Not on file    Allergies:  Allergies  Allergen Reactions  . Percocet [Oxycodone-Acetaminophen] Itching  . Wellbutrin [Bupropion] Hives    Hallucinations     Metabolic Disorder Labs: No results found for: HGBA1C, MPG No results found for: PROLACTIN Lab Results  Component Value Date   CHOL 144 04/19/2013   TRIG 58 04/19/2013   HDL 46 04/19/2013   CHOLHDL 3.1 04/19/2013   VLDL 12 04/19/2013   LDLCALC 86 04/19/2013   LDLCALC 64 04/26/2011   No results found for: TSH  Therapeutic Level Labs: No results found for: LITHIUM No results found for: VALPROATE No components found for:  CBMZ  Current  Medications: Current Outpatient Medications  Medication Sig Dispense Refill  . lisdexamfetamine (VYVANSE) 40 MG capsule Take 1 capsule (40 mg total) by mouth every morning. 30 capsule 0  . lisdexamfetamine (VYVANSE) 40 MG capsule Take 1 capsule (40 mg total) by mouth every morning. 30 capsule 0  . lisdexamfetamine (VYVANSE) 40 MG capsule Take 1 capsule (40 mg total) by mouth every morning. 30 capsule 0  . amphetamine-dextroamphetamine (ADDERALL) 10 MG tablet Take at 3 pm 30 tablet 0  . amphetamine-dextroamphetamine (ADDERALL) 10 MG tablet Take at 3 pm 90 tablet 0  . amphetamine-dextroamphetamine (ADDERALL) 10 MG tablet Take at 3 pm 90 tablet 0   No current facility-administered medications for this visit.      Musculoskeletal: Strength & Muscle Tone: within normal limits Gait & Station: normal Patient leans: N/A  Psychiatric Specialty Exam: Review of Systems  All other systems reviewed and are negative.   Blood pressure 134/88, pulse 79, height '5\' 3"'$  (1.6 m), weight 150 lb (68 kg), SpO2 100 %.Body mass index is 26.57 kg/m.  General Appearance: Casual, Neat and Well Groomed  Eye Contact:  Good  Speech:  Clear and Coherent  Volume:  Normal  Mood:  Euthymic  Affect:  Appropriate and Congruent  Thought Process:  Goal Directed  Orientation:  Full (Time, Place, and Person)  Thought Content: WDL   Suicidal Thoughts:  No  Homicidal Thoughts:  No  Memory:  Immediate;   Good Recent;   Good Remote;   Good  Judgement:  Good  Insight:  Good  Psychomotor Activity:  Normal  Concentration:  Concentration: Good and Attention Span: Good  Recall:  Good  Fund of Knowledge: Good  Language: Good  Akathisia:  No  Handed:  Right  AIMS (if indicated): not done  Assets:  Communication Skills Desire for Improvement Physical Health Resilience Social Support Talents/Skills Vocational/Educational  ADL's:  Intact  Cognition: WNL  Sleep:  Good   Screenings:   Assessment and Plan: This  patient is a 32 year old female with a history of ADD.  She does well on Vyvanse 40 mg until it wears off later in the day.  We will therefore add 10 mg to take around 3:00 PM.  She will return to see me in 3 months   Levonne Spiller, MD 08/13/2018, 4:03 PM

## 2018-08-15 MED FILL — valACYclovir HCL 1 GM TABS: 1 | 10 days supply | Qty: 20 | Fill #0

## 2018-09-12 MED FILL — VYVANSE 40 MG CAPSULE: 40 | 30 days supply | Qty: 30 | Fill #0

## 2018-09-12 MED FILL — AMPHETAMINE-DEXTROAMPHETAMI: 10 | 90 days supply | Qty: 90 | Fill #0

## 2018-10-17 MED FILL — VYVANSE 40 MG CAPSULE: 40 | 30 days supply | Qty: 30 | Fill #0

## 2018-10-29 DIAGNOSIS — Z0000 Encounter for general adult medical examination without abnormal findings: Secondary | ICD-10-CM | POA: Diagnosis not present

## 2018-10-29 DIAGNOSIS — Z6825 Body mass index (BMI) 25.0-25.9, adult: Secondary | ICD-10-CM | POA: Diagnosis not present

## 2018-10-29 DIAGNOSIS — Z23 Encounter for immunization: Secondary | ICD-10-CM | POA: Diagnosis not present

## 2018-11-13 ENCOUNTER — Ambulatory Visit (HOSPITAL_COMMUNITY): Payer: No Typology Code available for payment source | Admitting: Psychiatry

## 2018-11-13 ENCOUNTER — Encounter (HOSPITAL_COMMUNITY): Payer: Self-pay | Admitting: Psychiatry

## 2018-11-13 DIAGNOSIS — F900 Attention-deficit hyperactivity disorder, predominantly inattentive type: Secondary | ICD-10-CM | POA: Diagnosis not present

## 2018-11-13 MED ORDER — LISDEXAMFETAMINE DIMESYLATE 40 MG PO CAPS: 40.0000 mg | ORAL_CAPSULE | ORAL | 0 refills | Status: DC

## 2018-11-13 MED ORDER — AMPHETAMINE-DEXTROAMPHETAMINE 10 MG PO TABS: ORAL_TABLET | ORAL | 0 refills | Status: DC

## 2018-11-13 NOTE — Progress Notes (Signed)
[facility] MD/PA/NP OP Progress Note  11/13/2018 3:34 PM [name]  MRN:  [id number]  Chief Complaint:  Chief Complaint    ADD; Follow-up     HPI: This patient is a 33 year old divorced black female who lives with her 2 daughters in [place].  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  She continues on Vyvanse 40 mg daily and Adderall 10 mg at 3 PM as needed.  She does not seem to need the Adderall every day.  Staying focused functioning well at work and is eating well.  She has not lost any further weight.  She generally sleeps well at night.  She denies any symptoms of depression or anxiety. Visit Diagnosis:    ICD-10-CM   1. Attention deficit hyperactivity disorder (ADHD), predominantly inattentive type F90.0 lisdexamfetamine (VYVANSE) 40 MG capsule    Past Psychiatric History: none  Past Medical History:  Past Medical History:  Diagnosis Date  . Bell's palsy   . Bell's palsy     Past Surgical History:  Procedure Laterality Date  . BILATERAL SALPINGECTOMY  2013   Due to tubal pregnancy  . CESAREAN SECTION  x 2  . TONSILLECTOMY  1991  . TUBAL LIGATION    . WISDOM TOOTH EXTRACTION      Family Psychiatric History: See below  Family History:  Family History  Problem Relation Age of Onset  . Depression Mother   . Hypertension Mother   . Asthma Mother   . Hypertension Father   . Cancer Maternal Grandmother        ovarian   . Cancer Paternal Grandfather        lung   . Asthma Brother   . Heart disease Unknown   . Arthritis Unknown   . Lung disease Unknown   . Cancer Unknown   . Asthma Unknown   . Anesthesia problems Neg Hx   . Hypotension Neg Hx   . Malignant hyperthermia Neg Hx   . Pseudochol deficiency Neg Hx     Social History:  Social History   Socioeconomic History  . Marital status: Married    Spouse name: Not on file  . Number of children: Not on  file  . Years of education: college  . Highest education level: Not on file  Occupational History  . Occupation: Public relations account executive: [employer]  Social Needs  . Financial resource strain: Not on file  . Food insecurity:    Worry: Not on file    Inability: Not on file  . Transportation needs:    Medical: Not on file    Non-medical: Not on file  Tobacco Use  . Smoking status: Former Smoker    Packs/day: 0.25    Years: 3.00    Pack years: 0.75    Types: Cigarettes    Last attempt to quit: 02/09/2002    Years since quitting: 16.7  . Smokeless tobacco: Never Used  Substance and Sexual Activity  . Alcohol use: Yes    Comment: occasionally  . Drug use: No  . Sexual activity: Yes    Birth control/protection: Surgical  Lifestyle  . Physical activity:    Days per week: Not on file    Minutes per session: Not on file  . Stress: Not on file  Relationships  . Social connections:    Talks on phone: Not on file  Gets together: Not on file    Attends religious service: Not on file    Active member of club or organization: Not on file    Attends meetings of clubs or organizations: Not on file    Relationship status: Not on file  Other Topics Concern  . Not on file  Social History Narrative  . Not on file    Allergies:  Allergies  Allergen Reactions  . Percocet [Oxycodone-Acetaminophen] Itching  . Wellbutrin [Bupropion] Hives    Hallucinations     Metabolic Disorder Labs: No results found for: HGBA1C, MPG No results found for: PROLACTIN Lab Results  Component Value Date   CHOL 144 04/19/2013   TRIG 58 04/19/2013   HDL 46 04/19/2013   CHOLHDL 3.1 04/19/2013   VLDL 12 04/19/2013   LDLCALC 86 04/19/2013   LDLCALC 64 04/26/2011   No results found for: TSH  Therapeutic Level Labs: No results found for: LITHIUM No results found for: VALPROATE No components found for:  CBMZ  Current Medications: Current Outpatient Medications  Medication Sig Dispense Refill  .  amphetamine-dextroamphetamine (ADDERALL) 10 MG tablet Take at 3 pm 30 tablet 0  . amphetamine-dextroamphetamine (ADDERALL) 10 MG tablet Take at 3 pm 30 tablet 0  . lisdexamfetamine (VYVANSE) 40 MG capsule Take 1 capsule (40 mg total) by mouth every morning. 30 capsule 0  . lisdexamfetamine (VYVANSE) 40 MG capsule Take 1 capsule (40 mg total) by mouth every morning. 30 capsule 0  . lisdexamfetamine (VYVANSE) 40 MG capsule Take 1 capsule (40 mg total) by mouth every morning. 30 capsule 0   No current facility-administered medications for this visit.      Musculoskeletal: Strength & Muscle Tone: within normal limits Gait & Station: normal Patient leans: N/A  Psychiatric Specialty Exam: Review of Systems  All other systems reviewed and are negative.   Blood pressure 128/82, pulse 76, height 5\' 3"  (1.6 m), weight 148 lb (67.1 kg), SpO2 98 %.Body mass index is 26.22 kg/m.  General Appearance: Casual, Neat and Well Groomed  Eye Contact:  Good  Speech:  Clear and Coherent  Volume:  Normal  Mood:  Euthymic  Affect:  Appropriate and Congruent  Thought Process:  Goal Directed  Orientation:  Full (Time, Place, and Person)  Thought Content: WDL   Suicidal Thoughts:  No  Homicidal Thoughts:  No  Memory:  Immediate;   Good Recent;   Good Remote;   Good  Judgement:  Good  Insight:  Good  Psychomotor Activity:  Normal  Concentration:  Concentration: Good and Attention Span: Good  Recall:  Good  Fund of Knowledge: Good  Language: Good  Akathisia:  No  Handed:  Right  AIMS (if indicated): not done  Assets:  Communication Skills Desire for Improvement Physical Health Resilience Social Support Talents/Skills Vocational/Educational  ADL's:  Intact  Cognition: WNL  Sleep:  Good   Screenings:   Assessment and Plan: This patient is a 33 year old female who is doing well on her current regiment for ADD.  She will continue Vyvanse 40 mg every morning and Adderall 10 mg in the  afternoon as needed.  She will return to see me in 3 months   Diannia Ruder, MD 11/13/2018, 3:34 PM

## 2018-11-15 MED FILL — VYVANSE 40 MG CAPSULE: 40 | 30 days supply | Qty: 30 | Fill #0

## 2018-12-12 MED FILL — AMPHETAMINE-DEXTROAMPHETAMI: 10 | 30 days supply | Qty: 30 | Fill #0

## 2018-12-18 MED FILL — VYVANSE 40 MG CAPSULE: 40 | 30 days supply | Qty: 30 | Fill #0

## 2018-12-18 MED FILL — predniSONE 20 MG TABS: 20 | 5 days supply | Qty: 10 | Fill #0

## 2019-01-16 MED FILL — VYVANSE 40 MG CAPSULE: 40 | 30 days supply | Qty: 30 | Fill #0

## 2019-01-16 MED FILL — AMPHETAMINE-DEXTROAMPHETAMI: 10 | 30 days supply | Qty: 30 | Fill #0

## 2019-02-12 ENCOUNTER — Ambulatory Visit (INDEPENDENT_AMBULATORY_CARE_PROVIDER_SITE_OTHER): Payer: No Typology Code available for payment source | Admitting: Psychiatry

## 2019-02-12 ENCOUNTER — Other Ambulatory Visit: Payer: Self-pay

## 2019-02-12 ENCOUNTER — Encounter (HOSPITAL_COMMUNITY): Payer: Self-pay | Admitting: Psychiatry

## 2019-02-12 DIAGNOSIS — F900 Attention-deficit hyperactivity disorder, predominantly inattentive type: Secondary | ICD-10-CM | POA: Diagnosis not present

## 2019-02-12 MED ORDER — LISDEXAMFETAMINE DIMESYLATE 40 MG PO CAPS: 40.0000 mg | ORAL_CAPSULE | ORAL | 0 refills | Status: DC

## 2019-02-12 MED ORDER — AMPHETAMINE-DEXTROAMPHETAMINE 10 MG PO TABS: ORAL_TABLET | ORAL | 0 refills | Status: DC

## 2019-02-12 MED FILL — AMPHETAMINE-DEXTROAMPHETAMI: 10 | 30 days supply | Qty: 30 | Fill #0

## 2019-02-12 MED FILL — VYVANSE 40 MG CAPSULE: 40 | 30 days supply | Qty: 30 | Fill #0

## 2019-02-12 NOTE — Progress Notes (Signed)
Virtual Visit via Telephone Note  I connected with [name] on 02/12/19 at  3:20 PM EDT by telephone and verified that I am speaking with the correct person using two identifiers.   I discussed the limitations, risks, security and privacy concerns of performing an evaluation and management service by telephone and the availability of in person appointments. I also discussed with the patient that there may be a patient responsible charge related to this service. The patient expressed understanding and agreed to proceed.      I discussed the assessment and treatment plan with the patient. The patient was provided an opportunity to ask questions and all were answered. The patient agreed with the plan and demonstrated an understanding of the instructions.   The patient was advised to call back or seek an in-person evaluation if the symptoms worsen or if the condition fails to improve as anticipated.  I provided 15 minutes of non-face-to-face time during this encounter.   Levonne Spiller, MD  [facility] MD/PA/NP OP Progress Note  02/12/2019 3:28 PM [name]  MRN:  [id number]  Chief Complaint:  Chief Complaint    ADHD; Follow-up     HPI: This patient is a 33 year old divorced black female who lives with HER-2 daughters in [place].  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  The patient is assessed via telephone interview due to the coronavirus pandemic.  She continues on Vyvanse 40 mg daily and Adderall 10 mg at 3 PM for her ADD.  She states for the most part this is working well.  She does not take the Vyvanse 40 mg working as well as a higher strength but at the higher strengths she was not sleeping well and she was losing too much weight.  She is still working and seeing some patients live in some 3 visits.  For the most part she is able to stay quite focused.  Her daughters are in daycare while she works and they are doing fairly well.  She is  generally sleeping well at night.  She denies any symptoms of depression or anxiety Visit Diagnosis:    ICD-10-CM   1. Attention deficit hyperactivity disorder (ADHD), predominantly inattentive type F90.0 lisdexamfetamine (VYVANSE) 40 MG capsule    Past Psychiatric History: none  Past Medical History:  Past Medical History:  Diagnosis Date  . Bell's palsy   . Bell's palsy     Past Surgical History:  Procedure Laterality Date  . BILATERAL SALPINGECTOMY  2013   Due to tubal pregnancy  . CESAREAN SECTION  x 2  . TONSILLECTOMY  1991  . TUBAL LIGATION    . WISDOM TOOTH EXTRACTION      Family Psychiatric History: see below  Family History:  Family History  Problem Relation Age of Onset  . Depression Mother   . Hypertension Mother   . Asthma Mother   . Hypertension Father   . Cancer Maternal Grandmother        ovarian   . Cancer Paternal Grandfather        lung   . Asthma Brother   . Heart disease Unknown   . Arthritis Unknown   . Lung disease Unknown   . Cancer Unknown   . Asthma Unknown   . Anesthesia problems Neg Hx   . Hypotension Neg Hx   . Malignant hyperthermia Neg Hx   . Pseudochol deficiency Neg Hx     Social History:  Social History  Socioeconomic History  . Marital status: Married    Spouse name: Not on file  . Number of children: Not on file  . Years of education: college  . Highest education level: Not on file  Occupational History  . Occupation: Economist: [employer]  . Financial resource strain: Not on file  . Food insecurity:    Worry: Not on file    Inability: Not on file  . Transportation needs:    Medical: Not on file    Non-medical: Not on file  Tobacco Use  . Smoking status: Former Smoker    Packs/day: 0.25    Years: 3.00    Pack years: 0.75    Types: Cigarettes    Last attempt to quit: 02/09/2002    Years since quitting: 17.0  . Smokeless tobacco: Never Used  Substance and Sexual Activity  . Alcohol  use: Yes    Comment: occasionally  . Drug use: No  . Sexual activity: Yes    Birth control/protection: Surgical  Lifestyle  . Physical activity:    Days per week: Not on file    Minutes per session: Not on file  . Stress: Not on file  Relationships  . Social connections:    Talks on phone: Not on file    Gets together: Not on file    Attends religious service: Not on file    Active member of club or organization: Not on file    Attends meetings of clubs or organizations: Not on file    Relationship status: Not on file  Other Topics Concern  . Not on file  Social History Narrative  . Not on file    Allergies:  Allergies  Allergen Reactions  . Percocet [Oxycodone-Acetaminophen] Itching  . Wellbutrin [Bupropion] Hives    Hallucinations     Metabolic Disorder Labs: No results found for: HGBA1C, MPG No results found for: PROLACTIN Lab Results  Component Value Date   CHOL 144 04/19/2013   TRIG 58 04/19/2013   HDL 46 04/19/2013   CHOLHDL 3.1 04/19/2013   VLDL 12 04/19/2013   LDLCALC 86 04/19/2013   LDLCALC 64 04/26/2011   No results found for: TSH  Therapeutic Level Labs: No results found for: LITHIUM No results found for: VALPROATE No components found for:  CBMZ  Current Medications: Current Outpatient Medications  Medication Sig Dispense Refill  . amphetamine-dextroamphetamine (ADDERALL) 10 MG tablet Take at 3 pm 30 tablet 0  . amphetamine-dextroamphetamine (ADDERALL) 10 MG tablet Take at 3 pm 30 tablet 0  . amphetamine-dextroamphetamine (ADDERALL) 10 MG tablet Take at 3 pm 30 tablet 0  . lisdexamfetamine (VYVANSE) 40 MG capsule Take 1 capsule (40 mg total) by mouth every morning. 30 capsule 0  . lisdexamfetamine (VYVANSE) 40 MG capsule Take 1 capsule (40 mg total) by mouth every morning. 30 capsule 0  . lisdexamfetamine (VYVANSE) 40 MG capsule Take 1 capsule (40 mg total) by mouth every morning. 30 capsule 0   No current facility-administered medications for  this visit.      Musculoskeletal: Strength & Muscle Tone: not assessed, phone visit Gait & Station:  Patient leans:   Psychiatric Specialty Exam: Review of Systems  All other systems reviewed and are negative.   There were no vitals taken for this visit.There is no height or weight on file to calculate BMI.  General Appearance: NA  Eye Contact:  NA  Speech:  Clear and Coherent  Volume:  Normal  Mood:  Euthymic  Affect:  Congruent  Thought Process:  Goal Directed  Orientation:  Full (Time, Place, and Person)  Thought Content: WDL   Suicidal Thoughts:  No  Homicidal Thoughts:  No  Memory:  Immediate;   Good Recent;   Good Remote;   Good  Judgement:  Good  Insight:  Fair  Psychomotor Activity:  Normal  Concentration:  Concentration: Good and Attention Span: Good  Recall:  Good  Fund of Knowledge: Good  Language: Good  Akathisia:  No  Handed:  Right  AIMS (if indicated): not done  Assets:  Communication Skills Desire for Improvement Physical Health Resilience Social Support Talents/Skills Vocational/Educational  ADL's:  Intact  Cognition: WNL  Sleep:  Good   Screenings:   Assessment and Plan: This patient is a 33 year old female with a history of ADD, inattentive type.  She is doing well and staying focused on Vyvanse 40 mg every morning and Adderall 10 mg later in the afternoon.  She will continue this dosage and return to see me in 3 months   Levonne Spiller, MD 02/12/2019, 3:28 PM

## 2019-03-14 MED FILL — VYVANSE 40 MG CAPSULE: 40 | 30 days supply | Qty: 30 | Fill #0

## 2019-03-14 MED FILL — AMPHETAMINE-DEXTROAMPHETAMI: 10 | 30 days supply | Qty: 30 | Fill #0

## 2019-04-15 MED FILL — VYVANSE 40 MG CAPSULE: 40 | 30 days supply | Qty: 30 | Fill #0

## 2019-04-15 MED FILL — AMPHETAMINE-DEXTROAMPHETAMI: 10 | 30 days supply | Qty: 30 | Fill #0

## 2019-05-17 ENCOUNTER — Other Ambulatory Visit (HOSPITAL_COMMUNITY): Payer: Self-pay | Admitting: Psychiatry

## 2019-05-17 ENCOUNTER — Telehealth (HOSPITAL_COMMUNITY): Payer: Self-pay

## 2019-05-17 DIAGNOSIS — F900 Attention-deficit hyperactivity disorder, predominantly inattentive type: Secondary | ICD-10-CM

## 2019-05-17 MED ORDER — AMPHETAMINE-DEXTROAMPHETAMINE 10 MG PO TABS: ORAL_TABLET | ORAL | 0 refills | Status: DC

## 2019-05-17 MED ORDER — LISDEXAMFETAMINE DIMESYLATE 40 MG PO CAPS: 40.0000 mg | ORAL_CAPSULE | ORAL | 0 refills | Status: DC

## 2019-05-17 MED FILL — VYVANSE 40 MG CAPSULE: 40 | 30 days supply | Qty: 30 | Fill #0

## 2019-05-17 MED FILL — AMPHETAMINE-DEXTROAMPHETAMI: 10 | 30 days supply | Qty: 30 | Fill #0

## 2019-05-17 NOTE — Telephone Encounter (Signed)
sent 

## 2019-05-17 NOTE — Telephone Encounter (Signed)
Velta Addison from [facility] called regarding patient's Adderall 10mg . She needs clarification on the sig. It just says "Take at 3pm." Please review and advise. Thank you.

## 2019-05-17 NOTE — Telephone Encounter (Signed)
Patient called requesting refillls on her Adderall 10mg  and her Vyvanse 40mg . She requested they be filled at [facility]. Please review and advise. Thank you.

## 2019-05-17 NOTE — Telephone Encounter (Signed)
resent

## 2019-06-17 ENCOUNTER — Other Ambulatory Visit (HOSPITAL_COMMUNITY): Payer: Self-pay | Admitting: Psychiatry

## 2019-06-17 DIAGNOSIS — F900 Attention-deficit hyperactivity disorder, predominantly inattentive type: Secondary | ICD-10-CM

## 2019-06-17 MED FILL — VYVANSE 40 MG CAPSULE: 40 | 30 days supply | Qty: 30 | Fill #0

## 2019-06-17 MED FILL — AMPHETAMINE-DEXTROAMPHETAMI: 10 | 30 days supply | Qty: 30 | Fill #0

## 2019-06-20 MED FILL — valACYclovir HCL 1 GM TABS: 1 | 10 days supply | Qty: 20 | Fill #0

## 2019-06-20 MED FILL — predniSONE 20 MG TABS: 20 | 7 days supply | Qty: 15 | Fill #0

## 2019-07-11 ENCOUNTER — Other Ambulatory Visit: Payer: Self-pay

## 2019-07-11 ENCOUNTER — Encounter (HOSPITAL_COMMUNITY): Payer: Self-pay | Admitting: Psychiatry

## 2019-07-11 ENCOUNTER — Ambulatory Visit (INDEPENDENT_AMBULATORY_CARE_PROVIDER_SITE_OTHER): Payer: No Typology Code available for payment source | Admitting: Psychiatry

## 2019-07-11 DIAGNOSIS — F330 Major depressive disorder, recurrent, mild: Secondary | ICD-10-CM

## 2019-07-11 DIAGNOSIS — F900 Attention-deficit hyperactivity disorder, predominantly inattentive type: Secondary | ICD-10-CM | POA: Diagnosis not present

## 2019-07-11 MED ORDER — LISDEXAMFETAMINE DIMESYLATE 50 MG PO CAPS: 50.0000 mg | ORAL_CAPSULE | Freq: Every day | ORAL | 0 refills | Status: DC

## 2019-07-11 MED ORDER — SERTRALINE HCL 50 MG PO TABS: 50.0000 mg | ORAL_TABLET | Freq: Every day | ORAL | 3 refills | Status: DC

## 2019-07-11 MED ORDER — AMPHETAMINE-DEXTROAMPHETAMINE 10 MG PO TABS: ORAL_TABLET | ORAL | 0 refills | Status: DC

## 2019-07-11 MED FILL — VYVANSE 50 MG CAPSULE: 50 | 30 days supply | Qty: 30 | Fill #0

## 2019-07-11 MED FILL — SERTRALINE HCL 50 MG TABLET: 50 | 30 days supply | Qty: 30 | Fill #0

## 2019-07-11 NOTE — Progress Notes (Signed)
Virtual Visit via Telephone Note  I connected with [name] on 07/11/19 at  3:20 PM EDT by telephone and verified that I am speaking with the correct person using two identifiers.   I discussed the limitations, risks, security and privacy concerns of performing an evaluation and management service by telephone and the availability of in person appointments. I also discussed with the patient that there may be a patient responsible charge related to this service. The patient expressed understanding and agreed to proceed.      I discussed the assessment and treatment plan with the patient. The patient was provided an opportunity to ask questions and all were answered. The patient agreed with the plan and demonstrated an understanding of the instructions.   The patient was advised to call back or seek an in-person evaluation if the symptoms worsen or if the condition fails to improve as anticipated.  I provided 15 minutes of non-face-to-face time during this encounter.   Levonne Spiller, MD  [facility] MD/PA/NP OP Progress Note  07/11/2019 3:47 PM [name]  MRN:  [id number]  Chief Complaint:  Chief Complaint    Depression; ADD; Follow-up     HPI: This patient is a 33 year old divorced white female who lives with her-2 daughters in [place].  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  The patient is again assessed on the phone due to the coronavirus pandemic.  She states that she switched jobs about a month ago to a different clinic.  This has been somewhat stressful and having to learn a new system.  She is also been undergoing a lot of stress with her daughters being in virtual school and having to make sure they are getting all the work done.  She feels as if she is getting more depressed like she has been in the past.  She finds that she is crying for no good reason.  She is generally sleeping well but her energy is low and she feels exhausted after  work.  The patient did well on Zoloft in the past and would like to return to it perhaps at a lower dose.  She is not focusing as well as she used to and would like to go up a little bit on the Vyvanse.  She has been keeping her weight stable at around 150 Visit Diagnosis:    ICD-10-CM   1. Attention deficit hyperactivity disorder (ADHD), predominantly inattentive type  F90.0   2. Mild episode of recurrent major depressive disorder (HCC)  F33.0     Past Psychiatric History: none  Past Medical History:  Past Medical History:  Diagnosis Date  . Bell's palsy   . Bell's palsy     Past Surgical History:  Procedure Laterality Date  . BILATERAL SALPINGECTOMY  2013   Due to tubal pregnancy  . CESAREAN SECTION  x 2  . TONSILLECTOMY  1991  . TUBAL LIGATION    . WISDOM TOOTH EXTRACTION      Family Psychiatric History: see below  Family History:  Family History  Problem Relation Age of Onset  . Depression Mother   . Hypertension Mother   . Asthma Mother   . Hypertension Father   . Cancer Maternal Grandmother        ovarian   . Cancer Paternal Grandfather        lung   . Asthma Brother   . Heart disease Unknown   . Arthritis Unknown   .  Lung disease Unknown   . Cancer Unknown   . Asthma Unknown   . Anesthesia problems Neg Hx   . Hypotension Neg Hx   . Malignant hyperthermia Neg Hx   . Pseudochol deficiency Neg Hx     Social History:  Social History   Socioeconomic History  . Marital status: Married    Spouse name: Not on file  . Number of children: Not on file  . Years of education: college  . Highest education level: Not on file  Occupational History  . Occupation: Economist: [employer]  . Financial resource strain: Not on file  . Food insecurity    Worry: Not on file    Inability: Not on file  . Transportation needs    Medical: Not on file    Non-medical: Not on file  Tobacco Use  . Smoking status: Former Smoker    Packs/day: 0.25     Years: 3.00    Pack years: 0.75    Types: Cigarettes    Quit date: 02/09/2002    Years since quitting: 17.4  . Smokeless tobacco: Never Used  Substance and Sexual Activity  . Alcohol use: Yes    Comment: occasionally  . Drug use: No  . Sexual activity: Yes    Birth control/protection: Surgical  Lifestyle  . Physical activity    Days per week: Not on file    Minutes per session: Not on file  . Stress: Not on file  Relationships  . Social Herbalist on phone: Not on file    Gets together: Not on file    Attends religious service: Not on file    Active member of club or organization: Not on file    Attends meetings of clubs or organizations: Not on file    Relationship status: Not on file  Other Topics Concern  . Not on file  Social History Narrative  . Not on file    Allergies:  Allergies  Allergen Reactions  . Percocet [Oxycodone-Acetaminophen] Itching  . Wellbutrin [Bupropion] Hives    Hallucinations     Metabolic Disorder Labs: No results found for: HGBA1C, MPG No results found for: PROLACTIN Lab Results  Component Value Date   CHOL 144 04/19/2013   TRIG 58 04/19/2013   HDL 46 04/19/2013   CHOLHDL 3.1 04/19/2013   VLDL 12 04/19/2013   LDLCALC 86 04/19/2013   LDLCALC 64 04/26/2011   No results found for: TSH  Therapeutic Level Labs: No results found for: LITHIUM No results found for: VALPROATE No components found for:  CBMZ  Current Medications: Current Outpatient Medications  Medication Sig Dispense Refill  . amphetamine-dextroamphetamine (ADDERALL) 10 MG tablet TAKE 1 TABLET BY MOUTH AT 3PM DAILY 30 tablet 0  . amphetamine-dextroamphetamine (ADDERALL) 10 MG tablet Take at 3 pm 30 tablet 0  . amphetamine-dextroamphetamine (ADDERALL) 10 MG tablet Take at 3 pm 30 tablet 0  . lisdexamfetamine (VYVANSE) 40 MG capsule Take 1 capsule (40 mg total) by mouth every morning. 30 capsule 0  . lisdexamfetamine (VYVANSE) 40 MG capsule Take 1 capsule (40  mg total) by mouth every morning. 30 capsule 0  . lisdexamfetamine (VYVANSE) 50 MG capsule Take 1 capsule (50 mg total) by mouth daily. 30 capsule 0  . lisdexamfetamine (VYVANSE) 50 MG capsule Take 1 capsule (50 mg total) by mouth daily. 30 capsule 0  . sertraline (ZOLOFT) 50 MG tablet Take 1 tablet (50 mg total) by  mouth daily. 30 tablet 3  . VYVANSE 40 MG capsule TAKE 1 CAPSULE BY MOUTH EVERY MORNING. 30 capsule 0   No current facility-administered medications for this visit.      Musculoskeletal: Strength & Muscle Tone: within normal limits Gait & Station: normal Patient leans: Backward  Psychiatric Specialty Exam: Review of Systems  Psychiatric/Behavioral: Positive for depression.  All other systems reviewed and are negative.   There were no vitals taken for this visit.There is no height or weight on file to calculate BMI.  General Appearance: NA  Eye Contact:  NA  Speech:  Clear and Coherent  Volume:  Normal  Mood:  Dysphoric  Affect:  NA  Thought Process:  Goal Directed  Orientation:  Full (Time, Place, and Person)  Thought Content: Rumination   Suicidal Thoughts:  No  Homicidal Thoughts:  No  Memory:  Immediate;   Good Recent;   Good Remote;   Good  Judgement:  Good  Insight:  Good  Psychomotor Activity:  Normal  Concentration:  Concentration: Fair and Attention Span: Fair  Recall:  Good  Fund of Knowledge: Good  Language: Good  Akathisia:  No  Handed:  Right  AIMS (if indicated): not done  Assets:  Communication Skills Desire for Improvement Physical Health Resilience Social Support Talents/Skills Vocational/Educational  ADL's:  Intact  Cognition: WNL  Sleep:  Good   Screenings:   Assessment and Plan: This patient is a 33 year old female with a history of ADD and depression.  She has been doing well throughout with all the new stressors this year her depression has returned.  She denies any thoughts of suicide and is not a risk for suicide.  She will  restart Zoloft at 25 mg daily for 1 week and then work up to 50 mg daily.  She will increase Vyvanse to 50 mg daily for focus and continue Adderall 10 mg in the afternoon as needed.  She will return to see me in 2 months or call sooner if needed   Levonne Spiller, MD 07/11/2019, 3:47 PM

## 2019-07-18 MED FILL — AMPHETAMINE-DEXTROAMPHETAMI: 10 | 30 days supply | Qty: 30 | Fill #0

## 2019-08-13 MED FILL — VYVANSE 50 MG CAPSULE: 50 | 30 days supply | Qty: 30 | Fill #0

## 2019-08-13 MED FILL — SERTRALINE HCL 50 MG TABLET: 50 | 30 days supply | Qty: 30 | Fill #1

## 2019-08-19 MED FILL — AMPHETAMINE-DEXTROAMPHETAMI: 10 | 30 days supply | Qty: 30 | Fill #0

## 2019-09-12 ENCOUNTER — Encounter (HOSPITAL_COMMUNITY): Payer: Self-pay | Admitting: Psychiatry

## 2019-09-12 ENCOUNTER — Other Ambulatory Visit: Payer: Self-pay

## 2019-09-12 ENCOUNTER — Ambulatory Visit (INDEPENDENT_AMBULATORY_CARE_PROVIDER_SITE_OTHER): Payer: No Typology Code available for payment source | Admitting: Psychiatry

## 2019-09-12 DIAGNOSIS — F900 Attention-deficit hyperactivity disorder, predominantly inattentive type: Secondary | ICD-10-CM

## 2019-09-12 DIAGNOSIS — F330 Major depressive disorder, recurrent, mild: Secondary | ICD-10-CM | POA: Diagnosis not present

## 2019-09-12 MED ORDER — SERTRALINE HCL 50 MG PO TABS: 50.0000 mg | ORAL_TABLET | Freq: Every day | ORAL | 3 refills | Status: DC

## 2019-09-12 MED ORDER — AMPHETAMINE-DEXTROAMPHETAMINE 10 MG PO TABS: ORAL_TABLET | ORAL | 0 refills | Status: DC

## 2019-09-12 MED ORDER — LISDEXAMFETAMINE DIMESYLATE 50 MG PO CAPS: 50.0000 mg | ORAL_CAPSULE | Freq: Every day | ORAL | 0 refills | Status: DC

## 2019-09-12 MED FILL — VYVANSE 50 MG CAPSULE: 50 | 30 days supply | Qty: 30 | Fill #0

## 2019-09-12 MED FILL — SERTRALINE HCL 50 MG TABLET: 50 | 30 days supply | Qty: 30 | Fill #0

## 2019-09-12 NOTE — Progress Notes (Signed)
Virtual Visit via Video Note  I connected with Melissa Haradaourtney B Kille on 09/12/19 at  1:20 PM EST by a video enabled telemedicine application and verified that I am speaking with the correct person using two identifiers.   I discussed the limitations of evaluation and management by telemedicine and the availability of in person appointments. The patient expressed understanding and agreed to proceed.   I discussed the assessment and treatment plan with the patient. The patient was provided an opportunity to ask questions and all were answered. The patient agreed with the plan and demonstrated an understanding of the instructions.   The patient was advised to call back or seek an in-person evaluation if the symptoms worsen or if the condition fails to improve as anticipated.  I provided 15 minutes of non-face-to-face time during this encounter.   Diannia Rudereborah Meda Dudzinski, MD  [facility] MD/PA/NP OP Progress Note  09/12/2019 1:42 PM [name]  MRN:  [id number]  Chief Complaint:  Chief Complaint    Depression; ADHD     HPI: This patient is a 33 year old divorced white female who lives with her 2 daughters in [place].  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  The patient returns for follow-up after 2 months.  Last time she stated that she was feeling more overwhelmed and depressed particularly because she had started a new job and her she was also trying to help her kids with virtual learning.  We started her back on Zoloft 50 mg daily and she states now she is feeling better and "more like my old self."  She is also gotten used to the job in the routine there.  Her children are in a daycare center that offers and help with her virtual learning so that this is been less stressful.  The patient is sleeping well.  Her energy is good.  We had also increased Vyvanse to 50 mg and this is working well for her focus.  During the workweek she takes a booster of Adderall 10 mg at the  end of the day which is helpful. Visit Diagnosis:    ICD-10-CM   1. Attention deficit hyperactivity disorder (ADHD), predominantly inattentive type  F90.0   2. Mild episode of recurrent major depressive disorder (HCC)  F33.0     Past Psychiatric History: none  Past Medical History:  Past Medical History:  Diagnosis Date  . Bell's palsy   . Bell's palsy     Past Surgical History:  Procedure Laterality Date  . BILATERAL SALPINGECTOMY  2013   Due to tubal pregnancy  . CESAREAN SECTION  x 2  . TONSILLECTOMY  1991  . TUBAL LIGATION    . WISDOM TOOTH EXTRACTION      Family Psychiatric History: see below  Family History:  Family History  Problem Relation Age of Onset  . Depression Mother   . Hypertension Mother   . Asthma Mother   . Hypertension Father   . Cancer Maternal Grandmother        ovarian   . Cancer Paternal Grandfather        lung   . Asthma Brother   . Heart disease Unknown   . Arthritis Unknown   . Lung disease Unknown   . Cancer Unknown   . Asthma Unknown   . Anesthesia problems Neg Hx   . Hypotension Neg Hx   . Malignant hyperthermia Neg Hx   . Pseudochol deficiency Neg Hx  Social History:  Social History   Socioeconomic History  . Marital status: Married    Spouse name: Not on file  . Number of children: Not on file  . Years of education: college  . Highest education level: Not on file  Occupational History  . 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 Financial resource strain: Not on file  . Food insecurity    Worry: Not on file    Inability: Not on file  . Transportation needs    Medical: Not on file    Non-medical: Not on file  Tobacco Use  . Smoking status: Former Smoker    Packs/day: 0.25    Years: 3.00    Pack years: 0.75    Types: Cigarettes    Quit date: 02/09/2002    Years since quitting: 17.6  . Smokeless tobacco: Never Used  Substance and Sexual Activity  . Alcohol use: Yes    Comment: occasionally   . Drug use: No  . Sexual activity: Yes    Birth control/protection: Surgical  Lifestyle  . Physical activity    Days per week: Not on file    Minutes per session: Not on file  . Stress: Not on file  Relationships  . Social Herbalist on phone: Not on file    Gets together: Not on file    Attends religious service: Not on file    Active member of club or organization: Not on file    Attends meetings of clubs or organizations: Not on file    Relationship status: Not on file  Other Topics Concern  . Not on file  Social History Narrative  . Not on file    Allergies:  Allergies  Allergen Reactions  . Percocet [Oxycodone-Acetaminophen] Itching  . Wellbutrin [Bupropion] Hives    Hallucinations     Metabolic Disorder Labs: No results found for: HGBA1C, MPG No results found for: PROLACTIN Lab Results  Component Value Date   CHOL 144 04/19/2013   TRIG 58 04/19/2013   HDL 46 04/19/2013   CHOLHDL 3.1 04/19/2013   VLDL 12 04/19/2013   LDLCALC 86 04/19/2013   LDLCALC 64 04/26/2011   No results found for: TSH  Therapeutic Level Labs: No results found for: LITHIUM No results found for: VALPROATE No components found for:  CBMZ  Current Medications: Current Outpatient Medications  Medication Sig Dispense Refill  . amphetamine-dextroamphetamine (ADDERALL) 10 MG tablet Take at 3 pm 30 tablet 0  . amphetamine-dextroamphetamine (ADDERALL) 10 MG tablet Take at 3 pm 30 tablet 0  . amphetamine-dextroamphetamine (ADDERALL) 10 MG tablet TAKE 1 TABLET BY MOUTH AT 3PM DAILY 30 tablet 0  . lisdexamfetamine (VYVANSE) 50 MG capsule Take 1 capsule (50 mg total) by mouth daily. 30 capsule 0  . lisdexamfetamine (VYVANSE) 50 MG capsule Take 1 capsule (50 mg total) by mouth daily. 30 capsule 0  . lisdexamfetamine (VYVANSE) 50 MG capsule Take 1 capsule (50 mg total) by mouth daily. 30 capsule 0  . sertraline (ZOLOFT) 50 MG tablet Take 1 tablet (50 mg total) by mouth daily. 30 tablet  3  . VYVANSE 40 MG capsule TAKE 1 CAPSULE BY MOUTH EVERY MORNING. 30 capsule 0   No current facility-administered medications for this visit.      Musculoskeletal: Strength & Muscle Tone: within normal limits Gait & Station: normal Patient leans: N/A  Psychiatric Specialty Exam: Review of Systems  All other systems reviewed and are negative.  There were no vitals taken for this visit.There is no height or weight on file to calculate BMI.  General Appearance: Casual, Neat and Well Groomed  Eye Contact:  Good  Speech:  Clear and Coherent  Volume:  Normal  Mood:  Euthymic  Affect:  Appropriate and Congruent  Thought Process:  Goal Directed  Orientation:  Full (Time, Place, and Person)  Thought Content: WDL   Suicidal Thoughts:  No  Homicidal Thoughts:  No  Memory:  Immediate;   Good Recent;   Good Remote;   Good  Judgement:  Good  Insight:  Good  Psychomotor Activity:  Normal  Concentration:  Concentration: Good and Attention Span: Good  Recall:  Good  Fund of Knowledge: Good  Language: Good  Akathisia:  No  Handed:  Right  AIMS (if indicated): not done  Assets:  Communication Skills Desire for Improvement Physical Health Resilience Social Support Talents/Skills Vocational/Educational  ADL's:  Intact  Cognition: WNL  Sleep:  Good   Screenings:   Assessment and Plan: This patient is a 33 year old female with a history of ADD and depression.  She is doing well with her current regimen and no longer feels depressed and overwhelmed.  She will continue Zoloft 50 mg daily for depression and Vyvanse 50 mg every morning and Adderall 10 mg at 3 PM for focus.  She will return to see me in 3 months.   Diannia Ruder, MD 09/12/2019, 1:42 PM

## 2019-09-19 MED FILL — AMPHETAMINE-DEXTROAMPHETAMI: 10 | 30 days supply | Qty: 30 | Fill #0

## 2019-10-15 MED FILL — VYVANSE 50 MG CAPSULE: 50 | 30 days supply | Qty: 30 | Fill #0

## 2019-10-15 MED FILL — SERTRALINE HCL 50 MG TABS: 50 | 30 days supply | Qty: 30 | Fill #1

## 2019-10-21 MED FILL — AMPHETAMINE-DEXTROAMPHETAMI: 10 | 30 days supply | Qty: 30 | Fill #0

## 2019-11-15 MED FILL — SERTRALINE HCL 50 MG TABLET: 50 | 30 days supply | Qty: 30 | Fill #2

## 2019-11-15 MED FILL — VYVANSE 50 MG CAPSULE: 50 | 30 days supply | Qty: 30 | Fill #0

## 2019-11-22 MED FILL — AMPHETAMINE-DEXTROAMPHETAMI: 10 | 30 days supply | Qty: 30 | Fill #0

## 2019-12-16 ENCOUNTER — Other Ambulatory Visit (HOSPITAL_COMMUNITY): Payer: Self-pay | Admitting: Psychiatry

## 2019-12-16 MED FILL — VYVANSE 50 MG CAPSULE: 50 | 30 days supply | Qty: 30 | Fill #0

## 2019-12-16 MED FILL — SERTRALINE HCL 50 MG TABLET: 50 | 30 days supply | Qty: 30 | Fill #3

## 2019-12-27 ENCOUNTER — Other Ambulatory Visit (HOSPITAL_COMMUNITY): Payer: Self-pay | Admitting: Psychiatry

## 2019-12-27 MED FILL — AMPHETAMINE-DEXTROAMPHETAMI: 10 | 30 days supply | Qty: 30 | Fill #0

## 2020-01-14 ENCOUNTER — Other Ambulatory Visit (HOSPITAL_COMMUNITY): Payer: Self-pay | Admitting: Psychiatry

## 2020-01-14 MED FILL — SERTRALINE HCL 50 MG TABLET: 50 | 30 days supply | Qty: 30 | Fill #2

## 2020-01-14 MED FILL — VYVANSE 50 MG CAPSULE: 50 | 30 days supply | Qty: 30 | Fill #0

## 2020-01-23 ENCOUNTER — Other Ambulatory Visit (HOSPITAL_COMMUNITY): Payer: Self-pay | Admitting: Psychiatry

## 2020-01-29 MED FILL — AMPHETAMINE-DEXTROAMPHETAMI: 10 | 30 days supply | Qty: 30 | Fill #0

## 2020-02-13 ENCOUNTER — Other Ambulatory Visit (HOSPITAL_COMMUNITY): Payer: Self-pay | Admitting: Psychiatry

## 2020-02-13 MED FILL — VYVANSE 50 MG CAPSULE: 50 | 30 days supply | Qty: 30 | Fill #0

## 2020-02-13 MED FILL — SERTRALINE HCL 50 MG TABLET: 50 | 30 days supply | Qty: 30 | Fill #3

## 2020-03-02 ENCOUNTER — Other Ambulatory Visit (HOSPITAL_COMMUNITY): Payer: Self-pay | Admitting: Psychiatry

## 2020-03-02 MED FILL — AMPHETAMINE-DEXTROAMPHETAMI: 10 | 30 days supply | Qty: 30 | Fill #0

## 2020-03-18 ENCOUNTER — Other Ambulatory Visit (HOSPITAL_COMMUNITY): Payer: Self-pay | Admitting: Psychiatry

## 2020-03-18 MED FILL — SERTRALINE HCL 50 MG TABLET: 50 | 30 days supply | Qty: 30 | Fill #0

## 2020-03-18 MED FILL — VYVANSE 50 MG CAPSULE: 50 | 30 days supply | Qty: 30 | Fill #0

## 2020-05-07 ENCOUNTER — Other Ambulatory Visit: Payer: Self-pay

## 2020-05-07 ENCOUNTER — Encounter (HOSPITAL_COMMUNITY): Payer: Self-pay | Admitting: Psychiatry

## 2020-05-07 ENCOUNTER — Telehealth (INDEPENDENT_AMBULATORY_CARE_PROVIDER_SITE_OTHER): Payer: Self-pay | Admitting: Psychiatry

## 2020-05-07 DIAGNOSIS — F900 Attention-deficit hyperactivity disorder, predominantly inattentive type: Secondary | ICD-10-CM

## 2020-05-07 MED ORDER — AMPHETAMINE-DEXTROAMPHETAMINE 10 MG PO TABS: ORAL_TABLET | ORAL | 0 refills | Status: DC

## 2020-05-07 MED ORDER — LISDEXAMFETAMINE DIMESYLATE 60 MG PO CAPS: 60.0000 mg | ORAL_CAPSULE | ORAL | 0 refills | Status: DC

## 2020-05-07 MED ORDER — SERTRALINE HCL 50 MG PO TABS: 50.0000 mg | ORAL_TABLET | Freq: Every day | ORAL | 3 refills | Status: DC

## 2020-05-07 NOTE — Progress Notes (Signed)
Virtual Visit via Telephone Note  I connected with [name] on 05/07/20 at 10:00 AM EDT by telephone and verified that I am speaking with the correct person using two identifiers.   I discussed the limitations, risks, security and privacy concerns of performing an evaluation and management service by telephone and the availability of in person appointments. I also discussed with the patient that there may be a patient responsible charge related to this service. The patient expressed understanding and agreed to proceed.    I discussed the assessment and treatment plan with the patient. The patient was provided an opportunity to ask questions and all were answered. The patient agreed with the plan and demonstrated an understanding of the instructions.   The patient was advised to call back or seek an in-person evaluation if the symptoms worsen or if the condition fails to improve as anticipated.  I provided 15 minutes of non-face-to-face time during this encounter. Patient: Provider Home, patient home  Melissa Ruder, MD  [facility] MD/PA/NP OP Progress Note  05/07/2020 10:30 AM [name]  MRN:  [id number]  Chief Complaint:  Chief Complaint    ADD; Follow-up     HPI: This patient is a 34 year old divorced white female who lives with her 2 daughters and [place].  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  She states that overall she is doing well.  She is taken on a new position and has a lot of new job responsibilities such as helping [employer] family is enrolled for Medicaid and making sure children are all up-to-date on their immunizations etc.  She likes it a good deal and is enjoying the work.  She will be able to keep the same hours as her children so she will not have to use daycare.  She does report that she is a bit unfocused and having some trouble with time  management she would like a slight increase in Vyvanse and I think this is reasonable.  Her mood has been good. Visit Diagnosis:    ICD-10-CM   1. Attention deficit hyperactivity disorder (ADHD), predominantly inattentive type  F90.0     Past Psychiatric History: none  Past Medical History:  Past Medical History:  Diagnosis Date  . Bell's palsy   . Bell's palsy     Past Surgical History:  Procedure Laterality Date  . BILATERAL SALPINGECTOMY  2013   Due to tubal pregnancy  . CESAREAN SECTION  x 2  . TONSILLECTOMY  1991  . TUBAL LIGATION    . WISDOM TOOTH EXTRACTION      Family Psychiatric History: see below  Family History:  Family History  Problem Relation Age of Onset  . Depression Mother   . Hypertension Mother   . Asthma Mother   . Hypertension Father   . Cancer Maternal Grandmother        ovarian   . Cancer Paternal Grandfather        lung   . Asthma Brother   . Heart disease Unknown   . Arthritis Unknown   . Lung disease Unknown   . Cancer Unknown   . Asthma Unknown   . Anesthesia problems Neg Hx   . Hypotension Neg Hx   . Malignant hyperthermia Neg Hx   . Pseudochol deficiency Neg Hx     Social History:  Social History   Socioeconomic History  .  Marital status: Married    Spouse name: Not on file  . Number of children: Not on file  . Years of education: college  . Highest education level: Not on file  Occupational History  . Occupation: Public relations account executive: [employer]  Tobacco Use  . Smoking status: Former Smoker    Packs/day: 0.25    Years: 3.00    Pack years: 0.75    Types: Cigarettes    Quit date: 02/09/2002    Years since quitting: 18.2  . Smokeless tobacco: Never Used  Vaping Use  . Vaping Use: Never used  Substance and Sexual Activity  . Alcohol use: Yes    Comment: occasionally  . Drug use: No  . Sexual activity: Yes    Birth control/protection: Surgical  Other Topics Concern  . Not on file  Social History Narrative  . Not on  file   Social Determinants of Health   Financial Resource Strain:   . Difficulty of Paying Living Expenses:   Food Insecurity:   . Worried About Programme researcher, broadcasting/film/video in the Last Year:   . Barista in the Last Year:   Transportation Needs:   . Freight forwarder (Medical):   Marland Kitchen Lack of Transportation (Non-Medical):   Physical Activity:   . Days of Exercise per Week:   . Minutes of Exercise per Session:   Stress:   . Feeling of Stress :   Social Connections:   . Frequency of Communication with Friends and Family:   . Frequency of Social Gatherings with Friends and Family:   . Attends Religious Services:   . Active Member of Clubs or Organizations:   . Attends Banker Meetings:   Marland Kitchen Marital Status:     Allergies:  Allergies  Allergen Reactions  . Percocet [Oxycodone-Acetaminophen] Itching  . Wellbutrin [Bupropion] Hives    Hallucinations     Metabolic Disorder Labs: No results found for: HGBA1C, MPG No results found for: PROLACTIN Lab Results  Component Value Date   CHOL 144 04/19/2013   TRIG 58 04/19/2013   HDL 46 04/19/2013   CHOLHDL 3.1 04/19/2013   VLDL 12 04/19/2013   LDLCALC 86 04/19/2013   LDLCALC 64 04/26/2011   No results found for: TSH  Therapeutic Level Labs: No results found for: LITHIUM No results found for: VALPROATE No components found for:  CBMZ  Current Medications: Current Outpatient Medications  Medication Sig Dispense Refill  . amphetamine-dextroamphetamine (ADDERALL) 10 MG tablet Take at 3 pm 30 tablet 0  . amphetamine-dextroamphetamine (ADDERALL) 10 MG tablet TAKE 1 TABLET BY MOUTH AT 3PM DAILY 30 tablet 0  . amphetamine-dextroamphetamine (ADDERALL) 10 MG tablet Take one daily at 3 pm 30 tablet 0  . lisdexamfetamine (VYVANSE) 60 MG capsule Take 1 capsule (60 mg total) by mouth every morning. 30 capsule 0  . lisdexamfetamine (VYVANSE) 60 MG capsule Take 1 capsule (60 mg total) by mouth every morning. 30 capsule 0  .  lisdexamfetamine (VYVANSE) 60 MG capsule Take 1 capsule (60 mg total) by mouth every morning. 30 capsule 0   No current facility-administered medications for this visit.     Musculoskeletal: Strength & Muscle Tone: within normal limits Gait & Station: normal Patient leans: N/A  Psychiatric Specialty Exam: Review of Systems  Psychiatric/Behavioral: Positive for decreased concentration.  All other systems reviewed and are negative.   There were no vitals taken for this visit.There is no height or weight on file to calculate  BMI.  General Appearance: NA  Eye Contact:  NA  Speech:  Clear and Coherent  Volume:  Normal  Mood:  Euthymic  Affect:  NA  Thought Process:  Goal Directed  Orientation:  Full (Time, Place, and Person)  Thought Content: WDL   Suicidal Thoughts:  No  Homicidal Thoughts:  No  Memory:  Immediate;   Good Recent;   Good Remote;   Good  Judgement:  Good  Insight:  Good  Psychomotor Activity:  Normal  Concentration:  Concentration: Fair and Attention Span: Fair  Recall:  Good  Fund of Knowledge: Good  Language: Good  Akathisia:  No  Handed:  Right  AIMS (if indicated): not done  Assets:  Communication Skills Desire for Improvement Physical Health Resilience Social Support Talents/Skills  ADL's:  Intact  Cognition: WNL  Sleep:  Good   Screenings:   Assessment and Plan: This patient is a 34 year old female with a history of ADD and depression.  Her mood has been stable so she will continue Zoloft 50 mg daily.  She is not focusing quite as well she was like in her new job so we will increase Vyvanse to 60 mg every morning and continue Adderall 10 mg at 3 PM as a booster.  She will return to see me in 3 months   Melissa Ruder, MD 05/07/2020, 10:30 AM

## 2020-05-12 ENCOUNTER — Telehealth (HOSPITAL_COMMUNITY): Payer: Self-pay

## 2020-05-12 NOTE — Telephone Encounter (Signed)
[facility] APPROVED  VYVANSE 60MG  CAPSULE EFFECTIVE 05/07/2020 TO 05/08/2023

## 2020-06-17 ENCOUNTER — Telehealth (HOSPITAL_COMMUNITY): Payer: Self-pay | Admitting: Psychiatry

## 2020-06-17 NOTE — Telephone Encounter (Signed)
Left voicemail to call back to schedule follow up appt.

## 2020-07-27 ENCOUNTER — Telehealth (HOSPITAL_COMMUNITY): Payer: Self-pay | Admitting: Psychiatry

## 2020-07-27 NOTE — Telephone Encounter (Signed)
Called to schedule f/u

## 2020-08-05 ENCOUNTER — Encounter (HOSPITAL_COMMUNITY): Payer: Self-pay | Admitting: Psychiatry

## 2020-08-05 ENCOUNTER — Telehealth (INDEPENDENT_AMBULATORY_CARE_PROVIDER_SITE_OTHER): Payer: Self-pay | Admitting: Psychiatry

## 2020-08-05 ENCOUNTER — Other Ambulatory Visit: Payer: Self-pay

## 2020-08-05 DIAGNOSIS — F330 Major depressive disorder, recurrent, mild: Secondary | ICD-10-CM

## 2020-08-05 DIAGNOSIS — F900 Attention-deficit hyperactivity disorder, predominantly inattentive type: Secondary | ICD-10-CM

## 2020-08-05 MED ORDER — AMPHETAMINE-DEXTROAMPHETAMINE 10 MG PO TABS: ORAL_TABLET | ORAL | 0 refills | Status: DC

## 2020-08-05 MED ORDER — AMPHETAMINE-DEXTROAMPHETAMINE 10 MG PO TABS
ORAL_TABLET | ORAL | 0 refills | Status: DC
Start: 2020-08-05 — End: 2020-11-05

## 2020-08-05 MED ORDER — LISDEXAMFETAMINE DIMESYLATE 60 MG PO CAPS: 60.0000 mg | ORAL_CAPSULE | ORAL | 0 refills | Status: DC

## 2020-08-05 MED ORDER — SERTRALINE HCL 50 MG PO TABS: 50.0000 mg | ORAL_TABLET | Freq: Every day | ORAL | 3 refills | Status: DC

## 2020-08-05 NOTE — Progress Notes (Signed)
Virtual Visit via Telephone Note  I connected with [name] on 08/05/20 at  2:40 PM EDT by telephone and verified that I am speaking with the correct person using two identifiers.   I discussed the limitations, risks, security and privacy concerns of performing an evaluation and management service by telephone and the availability of in person appointments. I also discussed with the patient that there may be a patient responsible charge related to this service. The patient expressed understanding and agreed to proceed.    I discussed the assessment and treatment plan with the patient. The patient was provided an opportunity to ask questions and all were answered. The patient agreed with the plan and demonstrated an understanding of the instructions.   The patient was advised to call back or seek an in-person evaluation if the symptoms worsen or if the condition fails to improve as anticipated.  I provided 15 minutes of non-face-to-face time during this encounter. Location: Provider Home, patient home  Diannia Ruder, MD  [facility] MD/PA/NP OP Progress Note  08/05/2020 3:06 PM [name]  MRN:  [id number]  Chief Complaint:  Chief Complaint    ADHD; Follow-up     HPI: This patient is a 34 year old divorced white female who lives with her 2 daughters in [place].  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  The patient returns for follow-up after 3 months.  She states that she continues to do well.  Her focus is good and her mood is good.  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  She enjoys the job most of the time.  She states that she cut her Zoloft down to 25 mg daily and so far it is working well but she would like to leave it at 50 in case her depression gets a little worse during the winter months.  Her sleep energy and appetite are all good Visit Diagnosis:    ICD-10-CM   1. Attention deficit  hyperactivity disorder (ADHD), predominantly inattentive type  F90.0   2. Mild episode of recurrent major depressive disorder (HCC)  F33.0     Past Psychiatric History: none  Past Medical History:  Past Medical History:  Diagnosis Date  . Bell's palsy   . Bell's palsy     Past Surgical History:  Procedure Laterality Date  . BILATERAL SALPINGECTOMY  2013   Due to tubal pregnancy  . CESAREAN SECTION  x 2  . TONSILLECTOMY  1991  . TUBAL LIGATION    . WISDOM TOOTH EXTRACTION      Family Psychiatric History: see below  Family History:  Family History  Problem Relation Age of Onset  . Depression Mother   . Hypertension Mother   . Asthma Mother   . Hypertension Father   . Cancer Maternal Grandmother        ovarian   . Cancer Paternal Grandfather        lung   . Asthma Brother   . Heart disease Unknown   . Arthritis Unknown   . Lung disease Unknown   . Cancer Unknown   . Asthma Unknown   . Anesthesia problems Neg Hx   . Hypotension Neg Hx   . Malignant hyperthermia Neg Hx   . Pseudochol deficiency Neg Hx     Social History:  Social History   Socioeconomic History  . Marital status: Married    Spouse name: Not on  file  . Number of children: Not on file  . Years of education: college  . Highest education level: Not on file  Occupational History  . Occupation: Public relations account executive: [employer]  Tobacco Use  . Smoking status: Former Smoker    Packs/day: 0.25    Years: 3.00    Pack years: 0.75    Types: Cigarettes    Quit date: 02/09/2002    Years since quitting: 18.4  . Smokeless tobacco: Never Used  Vaping Use  . Vaping Use: Never used  Substance and Sexual Activity  . Alcohol use: Yes    Comment: occasionally  . Drug use: No  . Sexual activity: Yes    Birth control/protection: Surgical  Other Topics Concern  . Not on file  Social History Narrative  . Not on file   Social Determinants of Health   Financial Resource Strain:   . Difficulty of Paying  Living Expenses: Not on file  Food Insecurity:   . Worried About Programme researcher, broadcasting/film/video in the Last Year: Not on file  . Ran Out of Food in the Last Year: Not on file  Transportation Needs:   . Lack of Transportation (Medical): Not on file  . Lack of Transportation (Non-Medical): Not on file  Physical Activity:   . Days of Exercise per Week: Not on file  . Minutes of Exercise per Session: Not on file  Stress:   . Feeling of Stress : Not on file  Social Connections:   . Frequency of Communication with Friends and Family: Not on file  . Frequency of Social Gatherings with Friends and Family: Not on file  . Attends Religious Services: Not on file  . Active Member of Clubs or Organizations: Not on file  . Attends Banker Meetings: Not on file  . Marital Status: Not on file    Allergies:  Allergies  Allergen Reactions  . Percocet [Oxycodone-Acetaminophen] Itching  . Wellbutrin [Bupropion] Hives    Hallucinations     Metabolic Disorder Labs: No results found for: HGBA1C, MPG No results found for: PROLACTIN Lab Results  Component Value Date   CHOL 144 04/19/2013   TRIG 58 04/19/2013   HDL 46 04/19/2013   CHOLHDL 3.1 04/19/2013   VLDL 12 04/19/2013   LDLCALC 86 04/19/2013   LDLCALC 64 04/26/2011   No results found for: TSH  Therapeutic Level Labs: No results found for: LITHIUM No results found for: VALPROATE No components found for:  CBMZ  Current Medications: Current Outpatient Medications  Medication Sig Dispense Refill  . amphetamine-dextroamphetamine (ADDERALL) 10 MG tablet Take at 3 pm 30 tablet 0  . amphetamine-dextroamphetamine (ADDERALL) 10 MG tablet TAKE 1 TABLET BY MOUTH AT 3PM DAILY 30 tablet 0  . amphetamine-dextroamphetamine (ADDERALL) 10 MG tablet Take one daily at 3 pm 30 tablet 0  . lisdexamfetamine (VYVANSE) 60 MG capsule Take 1 capsule (60 mg total) by mouth every morning. 30 capsule 0  . lisdexamfetamine (VYVANSE) 60 MG capsule Take 1  capsule (60 mg total) by mouth every morning. 30 capsule 0  . lisdexamfetamine (VYVANSE) 60 MG capsule Take 1 capsule (60 mg total) by mouth every morning. 30 capsule 0  . sertraline (ZOLOFT) 50 MG tablet Take 1 tablet (50 mg total) by mouth daily. 30 tablet 3   No current facility-administered medications for this visit.     Musculoskeletal: Strength & Muscle Tone: within normal limits Gait & Station: normal Patient leans: N/A  Psychiatric Specialty  Exam: Review of Systems  All other systems reviewed and are negative.   There were no vitals taken for this visit.There is no height or weight on file to calculate BMI.  General Appearance: NA  Eye Contact:  NA  Speech:  Clear and Coherent  Volume:  Normal  Mood:  Euthymic  Affect:  NA  Thought Process:  Goal Directed  Orientation:  Full (Time, Place, and Person)  Thought Content: WDL   Suicidal Thoughts:  No  Homicidal Thoughts:  No  Memory:  Immediate;   Good Recent;   Fair Remote;   Fair  Judgement:  Good  Insight:  Good  Psychomotor Activity:  Normal  Concentration:  Concentration: Good and Attention Span: Good  Recall:  Good  Fund of Knowledge: Good  Language: Good  Akathisia:  No  Handed:  Right  AIMS (if indicated): not done  Assets:  Communication Skills Desire for Improvement Physical Health Resilience Social Support Talents/Skills  ADL's:  Intact  Cognition: WNL  Sleep:  Good   Screenings:   Assessment and Plan: Patient is a 34 year old female with a history of ADD and depression.  Her mood has been stable and she is doing well on Zoloft 25 mg for now.  However I will leave it at 50 in case her depression gets a little worse during the winter months.  She is focusing well and will continue Vyvanse 60 mg every morning and Adderall 10 mg at 3 PM.  She will return to see me in 3 months   Diannia Ruder, MD 08/05/2020, 3:06 PM

## 2020-08-13 ENCOUNTER — Ambulatory Visit: Payer: BC Managed Care – PPO | Attending: Internal Medicine

## 2020-08-13 DIAGNOSIS — Z23 Encounter for immunization: Secondary | ICD-10-CM

## 2020-08-13 NOTE — Progress Notes (Signed)
   Covid-19 Vaccination Clinic  Name:  [name]    MRN: [id number] DOB: 12/26/1985  08/13/2020  [name] was observed post Covid-19 immunization for 15 minutes without incident. She was provided with Vaccine Information Sheet and instruction to access the V-Safe system.   [name] was instructed to call 911 with any severe reactions post vaccine: Marland Kitchen Difficulty breathing  . Swelling of face and throat  . A fast heartbeat  . A bad rash all over body  . Dizziness and weakness

## 2020-11-05 ENCOUNTER — Encounter (HOSPITAL_COMMUNITY): Payer: Self-pay | Admitting: Psychiatry

## 2020-11-05 ENCOUNTER — Telehealth (INDEPENDENT_AMBULATORY_CARE_PROVIDER_SITE_OTHER): Payer: BC Managed Care – PPO | Admitting: Psychiatry

## 2020-11-05 ENCOUNTER — Other Ambulatory Visit: Payer: Self-pay

## 2020-11-05 DIAGNOSIS — F900 Attention-deficit hyperactivity disorder, predominantly inattentive type: Secondary | ICD-10-CM | POA: Diagnosis not present

## 2020-11-05 DIAGNOSIS — F330 Major depressive disorder, recurrent, mild: Secondary | ICD-10-CM | POA: Diagnosis not present

## 2020-11-05 MED ORDER — AMPHETAMINE-DEXTROAMPHETAMINE 10 MG PO TABS
ORAL_TABLET | ORAL | 0 refills | Status: DC
Start: 2020-11-05 — End: 2021-01-04

## 2020-11-05 MED ORDER — LISDEXAMFETAMINE DIMESYLATE 60 MG PO CAPS
60.0000 mg | ORAL_CAPSULE | ORAL | 0 refills | Status: DC
Start: 2020-11-05 — End: 2021-01-04

## 2020-11-05 NOTE — Progress Notes (Signed)
Virtual Visit via Telephone Note  I connected with [name] on 11/05/20 at  2:00 PM EST by telephone and verified that I am speaking with the correct person using two identifiers.  Location: Patient: home Provider:home   I discussed the limitations, risks, security and privacy concerns of performing an evaluation and management service by telephone and the availability of in person appointments. I also discussed with the patient that there may be a patient responsible charge related to this service. The patient expressed understanding and agreed to proceed.    I discussed the assessment and treatment plan with the patient. The patient was provided an opportunity to ask questions and all were answered. The patient agreed with the plan and demonstrated an understanding of the instructions.   The patient was advised to call back or seek an in-person evaluation if the symptoms worsen or if the condition fails to improve as anticipated.  I provided 15 minutes of non-face-to-face time during this encounter.   Diannia Ruder, MD  [facility] MD/PA/NP OP Progress Note  11/05/2020 2:27 PM [name]  MRN:  [id number]  Chief Complaint:  Chief Complaint    ADD; Follow-up     HPI: This patient is a 35 year old divorced white female who lives with her 2 daughters in [place].  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  Patient returns for follow-up after 3 months.  She states that she continues to do well.  She has had to deal with a lot of people out at work due to [employer] both teachers and students.  She is still enjoying her job though.  She has discontinued Zoloft altogether and feels fine without it.  She denies significant depression and anxiety.  She states that her focus and energy are good on the Vyvanse and Adderall. Visit Diagnosis:    ICD-10-CM   1. Attention deficit hyperactivity disorder (ADHD), predominantly inattentive type  F90.0   2. Mild  episode of recurrent major depressive disorder (HCC)  F33.0     Past Psychiatric History: none  Past Medical History:  Past Medical History:  Diagnosis Date  . Bell's palsy   . Bell's palsy     Past Surgical History:  Procedure Laterality Date  . BILATERAL SALPINGECTOMY  2013   Due to tubal pregnancy  . CESAREAN SECTION  x 2  . TONSILLECTOMY  1991  . TUBAL LIGATION    . WISDOM TOOTH EXTRACTION      Family Psychiatric History: See below  Family History:  Family History  Problem Relation Age of Onset  . Depression Mother   . Hypertension Mother   . Asthma Mother   . Hypertension Father   . Cancer Maternal Grandmother        ovarian   . Cancer Paternal Grandfather        lung   . Asthma Brother   . Heart disease Unknown   . Arthritis Unknown   . Lung disease Unknown   . Cancer Unknown   . Asthma Unknown   . Anesthesia problems Neg Hx   . Hypotension Neg Hx   . Malignant hyperthermia Neg Hx   . Pseudochol deficiency Neg Hx     Social History:  Social History   Socioeconomic History  . Marital status: Married    Spouse name: Not on file  . Number of children: Not on file  . Years of education: college  . Highest education level: Not on file  Occupational History  . Occupation: Public relations account executive: [employer]  Tobacco Use  . Smoking status: Former Smoker    Packs/day: 0.25    Years: 3.00    Pack years: 0.75    Types: Cigarettes    Quit date: 02/09/2002    Years since quitting: 18.7  . Smokeless tobacco: Never Used  Vaping Use  . Vaping Use: Never used  Substance and Sexual Activity  . Alcohol use: Yes    Comment: occasionally  . Drug use: No  . Sexual activity: Yes    Birth control/protection: Surgical  Other Topics Concern  . Not on file  Social History Narrative  . Not on file   Social Determinants of Health   Financial Resource Strain: Not on file  Food Insecurity: Not on file  Transportation Needs: Not on file  Physical Activity: Not on  file  Stress: Not on file  Social Connections: Not on file    Allergies:  Allergies  Allergen Reactions  . Percocet [Oxycodone-Acetaminophen] Itching  . Wellbutrin [Bupropion] Hives    Hallucinations     Metabolic Disorder Labs: No results found for: HGBA1C, MPG No results found for: PROLACTIN Lab Results  Component Value Date   CHOL 144 04/19/2013   TRIG 58 04/19/2013   HDL 46 04/19/2013   CHOLHDL 3.1 04/19/2013   VLDL 12 04/19/2013   LDLCALC 86 04/19/2013   LDLCALC 64 04/26/2011   No results found for: TSH  Therapeutic Level Labs: No results found for: LITHIUM No results found for: VALPROATE No components found for:  CBMZ  Current Medications: Current Outpatient Medications  Medication Sig Dispense Refill  . amphetamine-dextroamphetamine (ADDERALL) 10 MG tablet Take at 3 pm 30 tablet 0  . amphetamine-dextroamphetamine (ADDERALL) 10 MG tablet TAKE 1 TABLET BY MOUTH AT 3PM DAILY 30 tablet 0  . amphetamine-dextroamphetamine (ADDERALL) 10 MG tablet Take one daily at 3 pm 30 tablet 0  . lisdexamfetamine (VYVANSE) 60 MG capsule Take 1 capsule (60 mg total) by mouth every morning. 30 capsule 0  . lisdexamfetamine (VYVANSE) 60 MG capsule Take 1 capsule (60 mg total) by mouth every morning. 30 capsule 0  . lisdexamfetamine (VYVANSE) 60 MG capsule Take 1 capsule (60 mg total) by mouth every morning. 30 capsule 0  . sertraline (ZOLOFT) 50 MG tablet Take 1 tablet (50 mg total) by mouth daily. 30 tablet 3   No current facility-administered medications for this visit.     Musculoskeletal: Strength & Muscle Tone: within normal limits Gait & Station: normal Patient leans: N/A  Psychiatric Specialty Exam: Review of Systems  All other systems reviewed and are negative.   There were no vitals taken for this visit.There is no height or weight on file to calculate BMI.  General Appearance: NA  Eye Contact:  NA  Speech:  Clear and Coherent  Volume:  Normal  Mood:  Euthymic   Affect:  NA  Thought Process:  Goal Directed  Orientation:  NA  Thought Content: WDL   Suicidal Thoughts:  No  Homicidal Thoughts:  No  Memory:  Immediate;   Good Recent;   Good Remote;   Good  Judgement:  Good  Insight:  Good  Psychomotor Activity:  Normal  Concentration:  Concentration: Good and Attention Span: Good  Recall:  Good  Fund of Knowledge: Good  Language: Good  Akathisia:  No  Handed:  Right  AIMS (if indicated): not done  Assets:  Communication Skills Desire for Improvement Physical Health Resilience  Social Support Talents/Skills  ADL's:  Intact  Cognition: WNL  Sleep:  Good   Screenings:   Assessment and Plan: 35 year old female with a history of ADD and depression.  Her mood has been good and she no longer feels that she needs an antidepressant.  She is focusing well and will continue Vyvanse 60 mg in the morning and Adderall 10 mg at 3 PM.  She will return to see me 3 months   Levonne Spiller, MD 11/05/2020, 2:27 PM

## 2021-01-04 ENCOUNTER — Encounter (HOSPITAL_COMMUNITY): Payer: Self-pay | Admitting: Psychiatry

## 2021-01-04 ENCOUNTER — Other Ambulatory Visit: Payer: Self-pay

## 2021-01-04 ENCOUNTER — Telehealth (INDEPENDENT_AMBULATORY_CARE_PROVIDER_SITE_OTHER): Payer: BC Managed Care – PPO | Admitting: Psychiatry

## 2021-01-04 DIAGNOSIS — F900 Attention-deficit hyperactivity disorder, predominantly inattentive type: Secondary | ICD-10-CM | POA: Diagnosis not present

## 2021-01-04 MED ORDER — AMPHETAMINE-DEXTROAMPHETAMINE 10 MG PO TABS: ORAL_TABLET | ORAL | 0 refills | Status: DC

## 2021-01-04 MED ORDER — LISDEXAMFETAMINE DIMESYLATE 60 MG PO CAPS
60.0000 mg | ORAL_CAPSULE | ORAL | 0 refills | Status: DC
Start: 2021-01-04 — End: 2021-08-03

## 2021-01-04 NOTE — Progress Notes (Signed)
Virtual Visit via Video Note  I connected with [name] on 01/04/21 at 11:40 AM EST by a video enabled telemedicine application and verified that I am speaking with the correct person using two identifiers.  Location: Patient: home Provider: home   I discussed the limitations of evaluation and management by telemedicine and the availability of in person appointments. The patient expressed understanding and agreed to proceed.    I discussed the assessment and treatment plan with the patient. The patient was provided an opportunity to ask questions and all were answered. The patient agreed with the plan and demonstrated an understanding of the instructions.   The patient was advised to call back or seek an in-person evaluation if the symptoms worsen or if the condition fails to improve as anticipated.  I provided 15 minutes of non-face-to-face time during this encounter.   Diannia Ruder, MD  [facility] MD/PA/NP OP Progress Note  01/04/2021 11:51 AM [name]  MRN:  [id number]  Chief Complaint:  Chief Complaint    ADHD; Follow-up     HPI: This patient is a 35 year old divorced white female who lives with her 2 daughters in [place]. 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  Patient returns for follow-up after 3 months.  She is doing quite well on her current dosages.  She states that she is able to stay focused throughout the day and into the evening.  She is eating and sleeping well.  She is working with a Systems developer to help with time management and this is also going well.  She has no specific complaints.  She denies any depressive symptoms Visit Diagnosis:    ICD-10-CM   1. Attention deficit hyperactivity disorder (ADHD), predominantly inattentive type  F90.0     Past Psychiatric History: none  Past Medical History:  Past Medical History:  Diagnosis Date  . Bell's palsy   . Bell's palsy     Past Surgical History:  Procedure  Laterality Date  . BILATERAL SALPINGECTOMY  2013   Due to tubal pregnancy  . CESAREAN SECTION  x 2  . TONSILLECTOMY  1991  . TUBAL LIGATION    . WISDOM TOOTH EXTRACTION      Family Psychiatric History: see below  Family History:  Family History  Problem Relation Age of Onset  . Depression Mother   . Hypertension Mother   . Asthma Mother   . Hypertension Father   . Cancer Maternal Grandmother        ovarian   . Cancer Paternal Grandfather        lung   . Asthma Brother   . Heart disease Unknown   . Arthritis Unknown   . Lung disease Unknown   . Cancer Unknown   . Asthma Unknown   . Anesthesia problems Neg Hx   . Hypotension Neg Hx   . Malignant hyperthermia Neg Hx   . Pseudochol deficiency Neg Hx     Social History:  Social History   Socioeconomic History  . Marital status: Married    Spouse name: Not on file  . Number of children: Not on file  . Years of education: college  . Highest education level: Not on file  Occupational History  . Occupation: Public relations account executive: [employer]  Tobacco Use  . Smoking status: Former Smoker    Packs/day: 0.25    Years: 3.00    Pack years: 0.75    Types: Cigarettes  Quit date: 02/09/2002    Years since quitting: 18.9  . Smokeless tobacco: Never Used  Vaping Use  . Vaping Use: Never used  Substance and Sexual Activity  . Alcohol use: Yes    Comment: occasionally  . Drug use: No  . Sexual activity: Yes    Birth control/protection: Surgical  Other Topics Concern  . Not on file  Social History Narrative  . Not on file   Social Determinants of Health   Financial Resource Strain: Not on file  Food Insecurity: Not on file  Transportation Needs: Not on file  Physical Activity: Not on file  Stress: Not on file  Social Connections: Not on file    Allergies:  Allergies  Allergen Reactions  . Percocet [Oxycodone-Acetaminophen] Itching  . Wellbutrin [Bupropion] Hives    Hallucinations     Metabolic Disorder  Labs: No results found for: HGBA1C, MPG No results found for: PROLACTIN Lab Results  Component Value Date   CHOL 144 04/19/2013   TRIG 58 04/19/2013   HDL 46 04/19/2013   CHOLHDL 3.1 04/19/2013   VLDL 12 04/19/2013   LDLCALC 86 04/19/2013   LDLCALC 64 04/26/2011   No results found for: TSH  Therapeutic Level Labs: No results found for: LITHIUM No results found for: VALPROATE No components found for:  CBMZ  Current Medications: Current Outpatient Medications  Medication Sig Dispense Refill  . amphetamine-dextroamphetamine (ADDERALL) 10 MG tablet Take at 3 pm 30 tablet 0  . amphetamine-dextroamphetamine (ADDERALL) 10 MG tablet TAKE 1 TABLET BY MOUTH AT 3PM DAILY 30 tablet 0  . amphetamine-dextroamphetamine (ADDERALL) 10 MG tablet Take one daily at 3 pm 30 tablet 0  . lisdexamfetamine (VYVANSE) 60 MG capsule Take 1 capsule (60 mg total) by mouth every morning. 30 capsule 0  . lisdexamfetamine (VYVANSE) 60 MG capsule Take 1 capsule (60 mg total) by mouth every morning. 30 capsule 0  . lisdexamfetamine (VYVANSE) 60 MG capsule Take 1 capsule (60 mg total) by mouth every morning. 30 capsule 0   No current facility-administered medications for this visit.     Musculoskeletal: Strength & Muscle Tone: within normal limits Gait & Station: normal Patient leans: N/A  Psychiatric Specialty Exam: Review of Systems  All other systems reviewed and are negative.   There were no vitals taken for this visit.There is no height or weight on file to calculate BMI.  General Appearance: Casual and Fairly Groomed  Eye Contact:  Good  Speech:  Clear and Coherent  Volume:  Normal  Mood:  Euthymic  Affect:  Appropriate and Congruent  Thought Process:  Goal Directed  Orientation:  Full (Time, Place, and Person)  Thought Content: WDL   Suicidal Thoughts:  No  Homicidal Thoughts:  No  Memory:  Immediate;   Good Recent;   Good Remote;   Good  Judgement:  Good  Insight:  Good  Psychomotor  Activity:  Normal  Concentration:  Concentration: Good and Attention Span: Good  Recall:  Good  Fund of Knowledge: Good  Language: Good  Akathisia:  No  Handed:  Right  AIMS (if indicated): not done  Assets:  Communication Skills Desire for Improvement Physical Health Resilience Social Support Talents/Skills  ADL's:  Intact  Cognition: WNL  Sleep:  Good   Screenings:   Assessment and Plan: This patient is a 35 year old female with a history of ADD and depression.  She no longer feels depressed at all.  She is focusing well and will continue Vyvanse 60 mg in  the morning and Adderall 10 mg at 3 PM for ADD.  She will return to see me in 3 months   Diannia Ruder, MD 01/04/2021, 11:51 AM

## 2021-08-03 ENCOUNTER — Encounter (HOSPITAL_COMMUNITY): Payer: Self-pay | Admitting: Psychiatry

## 2021-08-03 ENCOUNTER — Other Ambulatory Visit: Payer: Self-pay

## 2021-08-03 ENCOUNTER — Telehealth (INDEPENDENT_AMBULATORY_CARE_PROVIDER_SITE_OTHER): Payer: BC Managed Care – PPO | Admitting: Psychiatry

## 2021-08-03 DIAGNOSIS — F900 Attention-deficit hyperactivity disorder, predominantly inattentive type: Secondary | ICD-10-CM

## 2021-08-03 MED ORDER — AMPHETAMINE-DEXTROAMPHETAMINE 10 MG PO TABS: ORAL_TABLET | ORAL | 0 refills | Status: DC

## 2021-08-03 MED ORDER — LISDEXAMFETAMINE DIMESYLATE 60 MG PO CAPS: 60.0000 mg | ORAL_CAPSULE | ORAL | 0 refills | Status: DC

## 2021-08-03 NOTE — Progress Notes (Signed)
Virtual Visit via Video Note  I connected with [name] on 08/03/21 at  9:40 AM EDT by a video enabled telemedicine application and verified that I am speaking with the correct person using two identifiers.  Location: Patient: home Provider: office   I discussed the limitations of evaluation and management by telemedicine and the availability of in person appointments. The patient expressed understanding and agreed to proceed.      I discussed the assessment and treatment plan with the patient. The patient was provided an opportunity to ask questions and all were answered. The patient agreed with the plan and demonstrated an understanding of the instructions.   The patient was advised to call back or seek an in-person evaluation if the symptoms worsen or if the condition fails to improve as anticipated.  I provided 10 minutes of non-face-to-face time during this encounter.   Diannia Ruder, MD  [facility] MD/PA/NP OP Progress Note  08/03/2021 9:56 AM [name]  MRN:  [id number]  Chief Complaint:  Chief Complaint   ADD; Follow-up    HPI: This patient is a 35 year old divorced white female who lives with her 2 daughters in [place].  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  The patient returns for follow-up regarding her ADD.  She states that she is focusing well on her current medication.  She is eating well and her weight is around 160 pounds.  She is sleeping well at night.  She denies any current symptoms of depression or anxiety.  She has no other specific complaints Visit Diagnosis:    ICD-10-CM   1. Attention deficit hyperactivity disorder (ADHD), predominantly inattentive type  F90.0 amphetamine-dextroamphetamine (ADDERALL) 10 MG tablet      Past Psychiatric History: none  Past Medical History:  Past Medical History:  Diagnosis Date   Bell's palsy    Bell's palsy     Past Surgical History:  Procedure Laterality Date    BILATERAL SALPINGECTOMY  2013   Due to tubal pregnancy   CESAREAN SECTION  x 2   TONSILLECTOMY  1991   TUBAL LIGATION     WISDOM TOOTH EXTRACTION      Family Psychiatric History: see below  Family History:  Family History  Problem Relation Age of Onset   Depression Mother    Hypertension Mother    Asthma Mother    Hypertension Father    Cancer Maternal Grandmother        ovarian    Cancer Paternal Grandfather        lung    Asthma Brother    Heart disease Unknown    Arthritis Unknown    Lung disease Unknown    Cancer Unknown    Asthma Unknown    Anesthesia problems Neg Hx    Hypotension Neg Hx    Malignant hyperthermia Neg Hx    Pseudochol deficiency Neg Hx     Social History:  Social History   Socioeconomic History   Marital status: Married    Spouse name: Not on file   Number of children: Not on file   Years of education: college   Highest education level: Not on file  Occupational History   Occupation: lpn    Employer: [employer]  Tobacco Use   Smoking status: Former    Packs/day: 0.25    Years: 3.00    Pack years: 0.75    Types: Cigarettes    Quit date: 02/09/2002    Years  since quitting: 19.4   Smokeless tobacco: Never  Vaping Use   Vaping Use: Never used  Substance and Sexual Activity   Alcohol use: Yes    Comment: occasionally   Drug use: No   Sexual activity: Yes    Birth control/protection: Surgical  Other Topics Concern   Not on file  Social History Narrative   Not on file   Social Determinants of Health   Financial Resource Strain: Not on file  Food Insecurity: Not on file  Transportation Needs: Not on file  Physical Activity: Not on file  Stress: Not on file  Social Connections: Not on file    Allergies:  Allergies  Allergen Reactions   Percocet [Oxycodone-Acetaminophen] Itching   Wellbutrin [Bupropion] Hives    Hallucinations     Metabolic Disorder Labs: No results found for: HGBA1C, MPG No results found for:  PROLACTIN Lab Results  Component Value Date   CHOL 144 04/19/2013   TRIG 58 04/19/2013   HDL 46 04/19/2013   CHOLHDL 3.1 04/19/2013   VLDL 12 04/19/2013   LDLCALC 86 04/19/2013   LDLCALC 64 04/26/2011   No results found for: TSH  Therapeutic Level Labs: No results found for: LITHIUM No results found for: VALPROATE No components found for:  CBMZ  Current Medications: Current Outpatient Medications  Medication Sig Dispense Refill   amphetamine-dextroamphetamine (ADDERALL) 10 MG tablet Take at 3 pm 30 tablet 0   amphetamine-dextroamphetamine (ADDERALL) 10 MG tablet TAKE 1 TABLET BY MOUTH AT 3PM DAILY 30 tablet 0   amphetamine-dextroamphetamine (ADDERALL) 10 MG tablet Take one daily at 3 pm 30 tablet 0   lisdexamfetamine (VYVANSE) 60 MG capsule Take 1 capsule (60 mg total) by mouth every morning. 30 capsule 0   lisdexamfetamine (VYVANSE) 60 MG capsule Take 1 capsule (60 mg total) by mouth every morning. 30 capsule 0   lisdexamfetamine (VYVANSE) 60 MG capsule Take 1 capsule (60 mg total) by mouth every morning. 30 capsule 0   No current facility-administered medications for this visit.     Musculoskeletal: Strength & Muscle Tone: within normal limits Gait & Station: normal Patient leans: N/A  Psychiatric Specialty Exam: Review of Systems  All other systems reviewed and are negative.  There were no vitals taken for this visit.There is no height or weight on file to calculate BMI.  General Appearance: Casual, Neat, and Well Groomed  Eye Contact:  Good  Speech:  Clear and Coherent  Volume:  Normal  Mood:  Euthymic  Affect:  Appropriate and Congruent  Thought Process:  Goal Directed  Orientation:  Full (Time, Place, and Person)  Thought Content: WDL   Suicidal Thoughts:  No  Homicidal Thoughts:  No  Memory:  Immediate;   Good Recent;   Good Remote;   Good  Judgement:  Good  Insight:  Good  Psychomotor Activity:  Normal  Concentration:  Concentration: Good and  Attention Span: Good  Recall:  Good  Fund of Knowledge: Good  Language: Good  Akathisia:  No  Handed:  Right  AIMS (if indicated): not done  Assets:  Communication Skills Desire for Improvement Physical Health Resilience Social Support Talents/Skills Vocational/Educational  ADL's:  Intact  Cognition: WNL  Sleep:  Good   Screenings:   Assessment and Plan: This patient is a 35 year old female with a history of ADD and depression.  She no longer has any depression symptoms.  She is focusing well and will continue Vyvanse 60 mg in the morning and Adderall 10 mg at  3 PM for ADD.  She will return to see me in 3 months   Diannia Ruder, MD 08/03/2021, 9:56 AM

## 2021-08-05 ENCOUNTER — Telehealth (HOSPITAL_COMMUNITY): Payer: Self-pay | Admitting: *Deleted

## 2021-08-05 NOTE — Telephone Encounter (Signed)
Patient called and LMOM stating the pharmacy informed her they do not have her script from provider. Per pt she recently had an appt with provider.    Staff called patient and [facility] informing patient her scripts were sent to the pharmacy (no names of script was left on vm). Office number provided if patient had any further questions.

## 2022-01-10 ENCOUNTER — Other Ambulatory Visit: Payer: Self-pay

## 2022-01-10 ENCOUNTER — Telehealth (INDEPENDENT_AMBULATORY_CARE_PROVIDER_SITE_OTHER): Payer: BC Managed Care – PPO | Admitting: Psychiatry

## 2022-01-10 ENCOUNTER — Encounter (HOSPITAL_COMMUNITY): Payer: Self-pay | Admitting: Psychiatry

## 2022-01-10 DIAGNOSIS — F900 Attention-deficit hyperactivity disorder, predominantly inattentive type: Secondary | ICD-10-CM | POA: Diagnosis not present

## 2022-01-10 MED ORDER — LISDEXAMFETAMINE DIMESYLATE 60 MG PO CAPS: 60.0000 mg | ORAL_CAPSULE | ORAL | 0 refills | Status: DC

## 2022-01-10 NOTE — Progress Notes (Signed)
Virtual Visit via Video Note ? ?I connected with [name] on 01/10/22 at  1:20 PM EDT by a video enabled telemedicine application and verified that I am speaking with the correct person using two identifiers. ? ?Location: ?Patient: work ?Provider: office ?  ?I discussed the limitations of evaluation and management by telemedicine and the availability of in person appointments. The patient expressed understanding and agreed to proceed. ? ? ?  ?I discussed the assessment and treatment plan with the patient. The patient was provided an opportunity to ask questions and all were answered. The patient agreed with the plan and demonstrated an understanding of the instructions. ?  ?The patient was advised to call back or seek an in-person evaluation if the symptoms worsen or if the condition fails to improve as anticipated. ? ?I provided 12 minutes of non-face-to-face time during this encounter. ? ? ?Diannia Ruder, MD ? ?BH MD/PA/NP OP Progress Note ? ?01/10/2022 1:35 PM ?[name]  ?MRN:  [id number] ? ?Chief Complaint:  ?Chief Complaint  ?Patient presents with  ? ADD  ? Follow-up  ? ?HPI: This patient is a 36 year old divorced black female who lives with her 2 daughters in [place].  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 ? ?The patient returns for follow-up regarding her ADD.  She states that she ran out of medication because she missed some appointments with me.  She states she can still focus without it but not very well.  It takes her a very long time to complete all of her tasks.  Interestingly since stopping the Vyvanse she is not sleeping as well.  She would like to go back to it.  She does not really think the small dose of Adderall in the late afternoon is necessary.  She denies symptoms of depression or anxiety or other health complaints. ?Visit Diagnosis:  ?  ICD-10-CM   ?1. Attention deficit hyperactivity disorder (ADHD), predominantly inattentive type  F90.0   ?   ? ? ?Past Psychiatric History: none ? ?Past Medical History:  ?Past Medical History:  ?Diagnosis Date  ? Bell's palsy   ? Bell's palsy   ?  ?Past Surgical History:  ?Procedure Laterality Date  ? BILATERAL SALPINGECTOMY  2013  ? Due to tubal pregnancy  ? CESAREAN SECTION  x 2  ? TONSILLECTOMY  1991  ? TUBAL LIGATION    ? WISDOM TOOTH EXTRACTION    ? ? ?Family Psychiatric History: see below ? ?Family History:  ?Family History  ?Problem Relation Age of Onset  ? Depression Mother   ? Hypertension Mother   ? Asthma Mother   ? Hypertension Father   ? Cancer Maternal Grandmother   ?     ovarian   ? Cancer Paternal Grandfather   ?     lung   ? Asthma Brother   ? Heart disease Unknown   ? Arthritis Unknown   ? Lung disease Unknown   ? Cancer Unknown   ? Asthma Unknown   ? Anesthesia problems Neg Hx   ? Hypotension Neg Hx   ? Malignant hyperthermia Neg Hx   ? Pseudochol deficiency Neg Hx   ? ? ?Social History:  ?Social History  ? ?Socioeconomic History  ? Marital status: Married  ?  Spouse name: Not on file  ? Number of children: Not on file  ? Years of education: college  ? Highest education level: Not on file  ?Occupational History  ? Occupation: lpn  ?  Employer: [employer]  ?Tobacco Use  ? Smoking status: Former  ?  Packs/day: 0.25  ?  Years: 3.00  ?  Pack years: 0.75  ?  Types: Cigarettes  ?  Quit date: 02/09/2002  ?  Years since quitting: 19.9  ? Smokeless tobacco: Never  ?Vaping Use  ? Vaping Use: Never used  ?Substance and Sexual Activity  ? Alcohol use: Yes  ?  Comment: occasionally  ? Drug use: No  ? Sexual activity: Yes  ?  Birth control/protection: Surgical  ?Other Topics Concern  ? Not on file  ?Social History Narrative  ? Not on file  ? ?Social Determinants of Health  ? ?Financial Resource Strain: Not on file  ?Food Insecurity: Not on file  ?Transportation Needs: Not on file  ?Physical Activity: Not on file  ?Stress: Not on file  ?Social Connections: Not on file  ? ? ?Allergies:  ?Allergies  ?Allergen Reactions   ? Percocet [Oxycodone-Acetaminophen] Itching  ? Wellbutrin [Bupropion] Hives  ?  Hallucinations   ? ? ?Metabolic Disorder Labs: ?No results found for: HGBA1C, MPG ?No results found for: PROLACTIN ?Lab Results  ?Component Value Date  ? CHOL 144 04/19/2013  ? TRIG 58 04/19/2013  ? HDL 46 04/19/2013  ? CHOLHDL 3.1 04/19/2013  ? VLDL 12 04/19/2013  ? LDLCALC 86 04/19/2013  ? LDLCALC 64 04/26/2011  ? ?No results found for: TSH ? ?Therapeutic Level Labs: ?No results found for: LITHIUM ?No results found for: VALPROATE ?No components found for:  CBMZ ? ?Current Medications: ?Current Outpatient Medications  ?Medication Sig Dispense Refill  ? lisdexamfetamine (VYVANSE) 60 MG capsule Take 1 capsule (60 mg total) by mouth every morning. 30 capsule 0  ? lisdexamfetamine (VYVANSE) 60 MG capsule Take 1 capsule (60 mg total) by mouth every morning. 30 capsule 0  ? lisdexamfetamine (VYVANSE) 60 MG capsule Take 1 capsule (60 mg total) by mouth every morning. 30 capsule 0  ? ?No current facility-administered medications for this visit.  ? ? ? ?Musculoskeletal: ?Strength & Muscle Tone: within normal limits ?Gait & Station: normal ?Patient leans: N/A ? ?Psychiatric Specialty Exam: ?Review of Systems  ?Psychiatric/Behavioral:  Positive for decreased concentration.   ?All other systems reviewed and are negative.  ?There were no vitals taken for this visit.There is no height or weight on file to calculate BMI.  ?General Appearance: Neat and Well Groomed  ?Eye Contact:  Good  ?Speech:  Clear and Coherent  ?Volume:  Normal  ?Mood:  Euthymic  ?Affect:  Appropriate and Congruent  ?Thought Process:  Goal Directed  ?Orientation:  Full (Time, Place, and Person)  ?Thought Content: WDL   ?Suicidal Thoughts:  No  ?Homicidal Thoughts:  No  ?Memory:  good  ?Judgement:  Good  ?Insight:  Good  ?Psychomotor Activity:  Normal  ?Concentration:  Concentration: Fair and Attention Span: Fair  ?Recall:  Good  ?Fund of Knowledge: Good  ?Language: Good   ?Akathisia:  No  ?Handed:  Right  ?AIMS (if indicated): not done  ?Assets:  Communication Skills ?Desire for Improvement ?Physical Health ?Resilience ?Social Support ?Talents/Skills  ?ADL's:  Intact  ?Cognition: WNL  ?Sleep:  Good  ? ?Screenings: ? ? ?Assessment and Plan: This patient is a 36 year old female with a history of ADD.  She is off medication right now but does better when she takes it in terms of focus.  She will restart Vyvanse 60 mg every morning.  She will return to see me in 3 months ? ?Collaboration of  Care: Collaboration of Care: Primary Care Provider AEB chart notes will be made available to PCP at patient's request ? ?Patient/Guardian was advised Release of Information must be obtained prior to any record release in order to collaborate their care with an outside provider. Patient/Guardian was advised if they have not already done so to contact the registration department to sign all necessary forms in order for [place] to release information regarding their care.  ? ?Consent: Patient/Guardian gives verbal consent for treatment and assignment of benefits for services provided during this visit. Patient/Guardian expressed understanding and agreed to proceed.  ? ? ?Diannia Ruder, MD ?01/10/2022, 1:35 PM ? ?

## 2022-05-10 ENCOUNTER — Encounter (HOSPITAL_COMMUNITY): Payer: Self-pay | Admitting: Psychiatry

## 2022-05-10 ENCOUNTER — Telehealth (INDEPENDENT_AMBULATORY_CARE_PROVIDER_SITE_OTHER): Payer: BC Managed Care – PPO | Admitting: Psychiatry

## 2022-05-10 DIAGNOSIS — F900 Attention-deficit hyperactivity disorder, predominantly inattentive type: Secondary | ICD-10-CM

## 2022-05-10 MED ORDER — AMPHETAMINE-DEXTROAMPHETAMINE 10 MG PO TABS: ORAL_TABLET | ORAL | 0 refills | Status: DC

## 2022-05-10 MED ORDER — AMPHETAMINE-DEXTROAMPHETAMINE 10 MG PO TABS
ORAL_TABLET | ORAL | 0 refills | Status: DC
Start: 2022-05-10 — End: 2022-08-05

## 2022-05-10 MED ORDER — LISDEXAMFETAMINE DIMESYLATE 60 MG PO CAPS: 60.0000 mg | ORAL_CAPSULE | ORAL | 0 refills | Status: DC

## 2022-05-10 NOTE — Progress Notes (Signed)
Virtual Visit via Video Note  I connected with [name] on 05/10/22 at  2:00 PM EDT by a video enabled telemedicine application and verified that I am speaking with the correct person using two identifiers.  Location: Patient: home Provider: office   I discussed the limitations of evaluation and management by telemedicine and the availability of in person appointments. The patient expressed understanding and agreed to proceed.      I discussed the assessment and treatment plan with the patient. The patient was provided an opportunity to ask questions and all were answered. The patient agreed with the plan and demonstrated an understanding of the instructions.   The patient was advised to call back or seek an in-person evaluation if the symptoms worsen or if the condition fails to improve as anticipated.  I provided 15 minutes of non-face-to-face time during this encounter.   Melissa Ruder, MD  [facility] MD/PA/NP OP Progress Note  05/10/2022 2:20 PM [name]  MRN:  [id number]  Chief Complaint:  Chief Complaint  Patient presents with   ADD   Follow-up   HPI: This patient is a 36 year old divorced black female who lives with her 2 daughters in [place].  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  Last time we eliminated the Vyvanse in the afternoons but now she thinks she really needs it.  She finds it hard to get through the entire workday without it.  She first asked about going up on the Vyvanse but then remembered when she was on the 70 mg dosage she lost a lot of weight.  Her weight is stable at the 60 mg dosage but she would like to have the Adderall 10 mg to use later in the day as needed.  I think this is reasonable.  She denies depression anxiety or any other psychiatric symptoms. Visit Diagnosis:    ICD-10-CM   1. Attention deficit hyperactivity disorder (ADHD), predominantly  inattentive type  F90.0       Past Psychiatric History: none  Past Medical History:  Past Medical History:  Diagnosis Date   Bell's palsy    Bell's palsy     Past Surgical History:  Procedure Laterality Date   BILATERAL SALPINGECTOMY  2013   Due to tubal pregnancy   CESAREAN SECTION  x 2   TONSILLECTOMY  1991   TUBAL LIGATION     WISDOM TOOTH EXTRACTION      Family Psychiatric History: See below  Family History:  Family History  Problem Relation Age of Onset   Depression Mother    Hypertension Mother    Asthma Mother    Hypertension Father    Cancer Maternal Grandmother        ovarian    Cancer Paternal Grandfather        lung    Asthma Brother    Heart disease Unknown    Arthritis Unknown    Lung disease Unknown    Cancer Unknown    Asthma Unknown    Anesthesia problems Neg Hx    Hypotension Neg Hx    Malignant hyperthermia Neg Hx    Pseudochol deficiency Neg Hx     Social History:  Social History   Socioeconomic History   Marital status: Divorced    Spouse name: Not on file   Number of children: Not on file   Years of education: college   Highest education level:  Not on file  Occupational History   Occupation: lpn    Employer: [employer]  Tobacco Use   Smoking status: Former    Packs/day: 0.25    Years: 3.00    Total pack years: 0.75    Types: Cigarettes    Quit date: 02/09/2002    Years since quitting: 20.2   Smokeless tobacco: Never  Vaping Use   Vaping Use: Never used  Substance and Sexual Activity   Alcohol use: Yes    Comment: occasionally   Drug use: No   Sexual activity: Yes    Birth control/protection: Surgical  Other Topics Concern   Not on file  Social History Narrative   Not on file   Social Determinants of Health   Financial Resource Strain: Not on file  Food Insecurity: Not on file  Transportation Needs: Not on file  Physical Activity: Not on file  Stress: Not on file  Social Connections: Not on file     Allergies:  Allergies  Allergen Reactions   Percocet [Oxycodone-Acetaminophen] Itching   Wellbutrin [Bupropion] Hives    Hallucinations     Metabolic Disorder Labs: No results found for: "HGBA1C", "MPG" No results found for: "PROLACTIN" Lab Results  Component Value Date   CHOL 144 04/19/2013   TRIG 58 04/19/2013   HDL 46 04/19/2013   CHOLHDL 3.1 04/19/2013   VLDL 12 04/19/2013   LDLCALC 86 04/19/2013   LDLCALC 64 04/26/2011   No results found for: "TSH"  Therapeutic Level Labs: No results found for: "LITHIUM" No results found for: "VALPROATE" No results found for: "CBMZ"  Current Medications: Current Outpatient Medications  Medication Sig Dispense Refill   amphetamine-dextroamphetamine (ADDERALL) 10 MG tablet Take at 3 pm 30 tablet 0   amphetamine-dextroamphetamine (ADDERALL) 10 MG tablet Take at 3 pm 30 tablet 0   amphetamine-dextroamphetamine (ADDERALL) 10 MG tablet Take at 3 pm 30 tablet 0   lisdexamfetamine (VYVANSE) 60 MG capsule Take 1 capsule (60 mg total) by mouth every morning. 30 capsule 0   lisdexamfetamine (VYVANSE) 60 MG capsule Take 1 capsule (60 mg total) by mouth every morning. 30 capsule 0   lisdexamfetamine (VYVANSE) 60 MG capsule Take 1 capsule (60 mg total) by mouth every morning. 30 capsule 0   No current facility-administered medications for this visit.     Musculoskeletal: Strength & Muscle Tone: within normal limits Gait & Station: normal Patient leans: N/A  Psychiatric Specialty Exam: Review of Systems  Psychiatric/Behavioral:  Positive for decreased concentration.   All other systems reviewed and are negative.   There were no vitals taken for this visit.There is no height or weight on file to calculate BMI.  General Appearance: Casual, Neat, and Well Groomed  Eye Contact:  Good  Speech:  Clear and Coherent  Volume:  Normal  Mood:  Euthymic  Affect:  Appropriate and Congruent  Thought Process:  Goal Directed  Orientation:   Full (Time, Place, and Person)  Thought Content: WDL   Suicidal Thoughts:  No  Homicidal Thoughts:  No  Memory:  Immediate;   Good Recent;   Good Remote;   Good  Judgement:  Good  Insight:  Good  Psychomotor Activity:  Normal  Concentration:  Concentration: Fair and Attention Span: Fair  Recall:  Good  Fund of Knowledge: Good  Language: Good  Akathisia:  No  Handed:  Right  AIMS (if indicated): not done  Assets:  Communication Skills Desire for Improvement Physical Health Resilience Social Support Talents/Skills  ADL's:  Intact  Cognition: WNL  Sleep:  Good   Screenings:   Assessment and Plan: This patient is a 36 year old female with a history of ADD and depression.  She no longer has depression but her ADD is well controlled until the afternoon.  She will continue Vyvanse 60 mg in the morning and the addition of Adderall 10 mg at 3 PM for ADD.  She will return to see me in 3 months  Collaboration of Care: Collaboration of Care: Primary Care Provider AEB notes will be shared with PCP at patient's request  Patient/Guardian was advised Release of Information must be obtained prior to any record release in order to collaborate their care with an outside provider. Patient/Guardian was advised if they have not already done so to contact the registration department to sign all necessary forms in order for [place] to release information regarding their care.   Consent: Patient/Guardian gives verbal consent for treatment and assignment of benefits for services provided during this visit. Patient/Guardian expressed understanding and agreed to proceed.    Melissa Ruder, MD 05/10/2022, 2:20 PM

## 2022-08-05 ENCOUNTER — Encounter (HOSPITAL_COMMUNITY): Payer: Self-pay | Admitting: Psychiatry

## 2022-08-05 ENCOUNTER — Telehealth (INDEPENDENT_AMBULATORY_CARE_PROVIDER_SITE_OTHER): Payer: BC Managed Care – PPO | Admitting: Psychiatry

## 2022-08-05 DIAGNOSIS — F330 Major depressive disorder, recurrent, mild: Secondary | ICD-10-CM

## 2022-08-05 DIAGNOSIS — F900 Attention-deficit hyperactivity disorder, predominantly inattentive type: Secondary | ICD-10-CM | POA: Diagnosis not present

## 2022-08-05 MED ORDER — LISDEXAMFETAMINE DIMESYLATE 60 MG PO CAPS: 60.0000 mg | ORAL_CAPSULE | ORAL | 0 refills | Status: DC

## 2022-08-05 MED ORDER — AMPHETAMINE-DEXTROAMPHETAMINE 10 MG PO TABS: ORAL_TABLET | ORAL | 0 refills | Status: DC

## 2022-08-05 MED ORDER — SERTRALINE HCL 50 MG PO TABS: 50.0000 mg | ORAL_TABLET | Freq: Every day | ORAL | 3 refills | Status: DC

## 2022-08-05 NOTE — Progress Notes (Signed)
Virtual Visit via Telephone Note  I connected with [name] on 08/05/22 at 11:00 AM EDT by telephone and verified that I am speaking with the correct person using two identifiers.  Location: Patient: work Provider: home office   I discussed the limitations, risks, security and privacy concerns of performing an evaluation and management service by telephone and the availability of in person appointments. I also discussed with the patient that there may be a patient responsible charge related to this service. The patient expressed understanding and agreed to proceed.     I discussed the assessment and treatment plan with the patient. The patient was provided an opportunity to ask questions and all were answered. The patient agreed with the plan and demonstrated an understanding of the instructions.   The patient was advised to call back or seek an in-person evaluation if the symptoms worsen or if the condition fails to improve as anticipated.  I provided 15 minutes of non-face-to-face time during this encounter.   Levonne Spiller, MD  [facility] MD/PA/NP OP Progress Note  08/05/2022 11:15 AM [name]  MRN:  [id number]  Chief Complaint:  Chief Complaint  Patient presents with   Anxiety   ADHD   Follow-up   HPI: This patient is a 36 year old divorced black female who lives with her 2 daughters in [place].  She is an Corporate treasurer and a Materials engineer for [employer]   Patient returns for follow-up after 3 months.  She states that she has been more anxious and slightly depressed lately.  She is not as happy as she was with her job in the past.  There is a new administrative changes and she is actually going to go back to [employer].  She states also in the day short in the cold weather started she tends to get a little bit more sad and droopy.  She denies any thoughts of self-harm or suicide and she is still enjoying time with her family.  She would like to retry the  Zoloft and I think this is reasonable.  The Vyvanse and Adderall continue to work well for her focus Visit Diagnosis:    ICD-10-CM   1. Attention deficit hyperactivity disorder (ADHD), predominantly inattentive type  F90.0     2. Mild episode of recurrent major depressive disorder ([place])  F33.0       Past Psychiatric History: none  Past Medical History:  Past Medical History:  Diagnosis Date   Bell's palsy    Bell's palsy     Past Surgical History:  Procedure Laterality Date   BILATERAL SALPINGECTOMY  2013   Due to tubal pregnancy   CESAREAN SECTION  x 2   TONSILLECTOMY  1991   TUBAL LIGATION     WISDOM TOOTH EXTRACTION      Family Psychiatric History: See below  Family History:  Family History  Problem Relation Age of Onset   Depression Mother    Hypertension Mother    Asthma Mother    Hypertension Father    Cancer Maternal Grandmother        ovarian    Cancer Paternal Grandfather        lung    Asthma Brother    Heart disease Unknown    Arthritis Unknown    Lung disease Unknown    Cancer Unknown    Asthma Unknown    Anesthesia problems Neg Hx    Hypotension Neg Hx    Malignant hyperthermia Neg Hx  Pseudochol deficiency Neg Hx     Social History:  Social History   Socioeconomic History   Marital status: Divorced    Spouse name: Not on file   Number of children: Not on file   Years of education: college   Highest education level: Not on file  Occupational History   Occupation: lpn    Employer: [employer]  Tobacco Use   Smoking status: Former    Packs/day: 0.25    Years: 3.00    Total pack years: 0.75    Types: Cigarettes    Quit date: 02/09/2002    Years since quitting: 20.4   Smokeless tobacco: Never  Vaping Use   Vaping Use: Never used  Substance and Sexual Activity   Alcohol use: Yes    Comment: occasionally   Drug use: No   Sexual activity: Yes    Birth control/protection: Surgical  Other Topics Concern   Not on file  Social  History Narrative   Not on file   Social Determinants of Health   Financial Resource Strain: Not on file  Food Insecurity: Not on file  Transportation Needs: Not on file  Physical Activity: Not on file  Stress: Not on file  Social Connections: Not on file    Allergies:  Allergies  Allergen Reactions   Percocet [Oxycodone-Acetaminophen] Itching   Wellbutrin [Bupropion] Hives    Hallucinations     Metabolic Disorder Labs: No results found for: "HGBA1C", "MPG" No results found for: "PROLACTIN" Lab Results  Component Value Date   CHOL 144 04/19/2013   TRIG 58 04/19/2013   HDL 46 04/19/2013   CHOLHDL 3.1 04/19/2013   VLDL 12 04/19/2013   LDLCALC 86 04/19/2013   LDLCALC 64 04/26/2011   No results found for: "TSH"  Therapeutic Level Labs: No results found for: "LITHIUM" No results found for: "VALPROATE" No results found for: "CBMZ"  Current Medications: Current Outpatient Medications  Medication Sig Dispense Refill   amphetamine-dextroamphetamine (ADDERALL) 10 MG tablet Take at 3 pm 30 tablet 0   amphetamine-dextroamphetamine (ADDERALL) 10 MG tablet Take at 3 pm 30 tablet 0   amphetamine-dextroamphetamine (ADDERALL) 10 MG tablet Take at 3 pm 30 tablet 0   lisdexamfetamine (VYVANSE) 60 MG capsule Take 1 capsule (60 mg total) by mouth every morning. 30 capsule 0   lisdexamfetamine (VYVANSE) 60 MG capsule Take 1 capsule (60 mg total) by mouth every morning. 30 capsule 0   lisdexamfetamine (VYVANSE) 60 MG capsule Take 1 capsule (60 mg total) by mouth every morning. 30 capsule 0   sertraline (ZOLOFT) 50 MG tablet Take 1 tablet (50 mg total) by mouth daily. 30 tablet 3   No current facility-administered medications for this visit.     Musculoskeletal: Strength & Muscle Tone: na Gait & Station: na Patient leans: N/A  Psychiatric Specialty Exam: Review of Systems  Psychiatric/Behavioral:  Positive for dysphoric mood.   All other systems reviewed and are negative.    There were no vitals taken for this visit.There is no height or weight on file to calculate BMI.  General Appearance: NA  Eye Contact:  NA  Speech:  Clear and Coherent  Volume:  Normal  Mood:  Euthymic, but a little more anxious and dysphoric than normal  Affect:  NA  Thought Process:  Goal Directed  Orientation:  Full (Time, Place, and Person)  Thought Content: Rumination   Suicidal Thoughts:  No  Homicidal Thoughts:  No  Memory:  Immediate;   Good Recent;  Good Remote;   Good  Judgement:  Good  Insight:  Good  Psychomotor Activity:  Normal  Concentration:  Concentration: Good and Attention Span: Good  Recall:  Good  Fund of Knowledge: Good  Language: Good  Akathisia:  No  Handed:  Right  AIMS (if indicated): not done  Assets:  Communication Skills Desire for Improvement Physical Health Resilience Social Support Talents/Skills  ADL's:  Intact  Cognition: WNL  Sleep:  Good   Screenings: PHQ2-9    Flowsheet Row Video Visit from 08/05/2022 [facility]  PHQ-2 Total Score 2  PHQ-9 Total Score 3      Flowsheet Row Video Visit from 08/05/2022 [facility]  [facility]-SS[facility] RISK CATEGORY No Risk        Assessment and Plan: Patient is a 36 year old female with a history of ADD and mild depression.  She thinks that she is getting a little bit more anxious and dysphoric so we will restart Zoloft 50 mg daily.  She will continue Vyvanse 60 mg in the morning and Adderall 10 mg at 3 PM for ADD.  She will return to see me in 3 months  Collaboration of Care: Collaboration of Care: Primary Care Provider AEB notes will be shared with PCP at patient's request  Patient/Guardian was advised Release of Information must be obtained prior to any record release in order to collaborate their care with an outside provider. Patient/Guardian was advised if they have not already done so to contact the  registration department to sign all necessary forms in order for [place] to release information regarding their care.   Consent: Patient/Guardian gives verbal consent for treatment and assignment of benefits for services provided during this visit. Patient/Guardian expressed understanding and agreed to proceed.    Diannia Ruder, MD 08/05/2022, 11:15 AM

## 2022-08-29 ENCOUNTER — Other Ambulatory Visit (HOSPITAL_COMMUNITY): Payer: Self-pay | Admitting: Psychiatry

## 2022-10-26 ENCOUNTER — Other Ambulatory Visit (HOSPITAL_COMMUNITY): Payer: Self-pay | Admitting: Psychiatry

## 2022-10-26 ENCOUNTER — Telehealth (HOSPITAL_COMMUNITY): Payer: Self-pay | Admitting: *Deleted

## 2022-10-26 ENCOUNTER — Other Ambulatory Visit (HOSPITAL_COMMUNITY): Payer: Self-pay

## 2022-10-26 MED ORDER — LISDEXAMFETAMINE DIMESYLATE 60 MG PO CAPS
60.0000 mg | ORAL_CAPSULE | ORAL | 0 refills | Status: DC
  Filled 2022-10-26: qty 30, 30d supply, fill #0

## 2022-10-26 NOTE — Telephone Encounter (Signed)
Patient called stating that [facility] do not have her script in stock. Per pt she have changed her pharmacy to [facility] off of [address] in [place], [place].   Per pt she would like to have refill for her Vyvanse.   Patient f/u appt is Jan 10th

## 2022-11-09 ENCOUNTER — Telehealth (INDEPENDENT_AMBULATORY_CARE_PROVIDER_SITE_OTHER): Payer: Commercial Managed Care - PPO | Admitting: Psychiatry

## 2022-11-09 ENCOUNTER — Other Ambulatory Visit (HOSPITAL_COMMUNITY): Payer: Self-pay

## 2022-11-09 ENCOUNTER — Encounter (HOSPITAL_COMMUNITY): Payer: Self-pay | Admitting: Psychiatry

## 2022-11-09 DIAGNOSIS — F330 Major depressive disorder, recurrent, mild: Secondary | ICD-10-CM | POA: Diagnosis not present

## 2022-11-09 DIAGNOSIS — F900 Attention-deficit hyperactivity disorder, predominantly inattentive type: Secondary | ICD-10-CM | POA: Diagnosis not present

## 2022-11-09 MED ORDER — AMPHETAMINE-DEXTROAMPHETAMINE 10 MG PO TABS
10.0000 mg | ORAL_TABLET | Freq: Every day | ORAL | 0 refills | Status: DC
  Filled 2022-11-09 – 2022-12-06 (×2): qty 30, 30d supply, fill #0

## 2022-11-09 MED ORDER — LISDEXAMFETAMINE DIMESYLATE 60 MG PO CAPS
60.0000 mg | ORAL_CAPSULE | ORAL | 0 refills | Status: DC
  Filled 2022-11-09 – 2023-03-22 (×2): qty 30, 30d supply, fill #0

## 2022-11-09 MED ORDER — AMPHETAMINE-DEXTROAMPHETAMINE 10 MG PO TABS
10.0000 mg | ORAL_TABLET | Freq: Every day | ORAL | 0 refills | Status: DC
  Filled 2022-11-09: qty 30, fill #0
  Filled 2023-03-22: qty 30, 30d supply, fill #0

## 2022-11-09 MED ORDER — LISDEXAMFETAMINE DIMESYLATE 60 MG PO CAPS
60.0000 mg | ORAL_CAPSULE | ORAL | 0 refills | Status: DC
  Filled 2022-11-09 – 2022-12-06 (×2): qty 30, 30d supply, fill #0

## 2022-11-09 MED ORDER — AMPHETAMINE-DEXTROAMPHETAMINE 10 MG PO TABS
10.0000 mg | ORAL_TABLET | Freq: Every day | ORAL | 0 refills | Status: DC
  Filled 2022-11-09: qty 30, fill #0
  Filled 2023-01-25: qty 30, 30d supply, fill #0

## 2022-11-09 MED ORDER — SERTRALINE HCL 50 MG PO TABS
50.0000 mg | ORAL_TABLET | Freq: Every day | ORAL | 2 refills | Status: DC
  Filled 2022-11-09 – 2023-01-25 (×3): qty 90, 90d supply, fill #0

## 2022-11-09 MED ORDER — LISDEXAMFETAMINE DIMESYLATE 60 MG PO CAPS
60.0000 mg | ORAL_CAPSULE | ORAL | 0 refills | Status: DC
  Filled 2022-11-09 – 2023-01-25 (×2): qty 30, 30d supply, fill #0

## 2022-11-09 NOTE — Progress Notes (Signed)
Virtual Visit via Video Note  I connected with [name] on 11/09/22 at 10:40 AM EST by a video enabled telemedicine application and verified that I am speaking with the correct person using two identifiers.  Location: Patient: home Provider: office   I discussed the limitations of evaluation and management by telemedicine and the availability of in person appointments. The patient expressed understanding and agreed to proceed.     I discussed the assessment and treatment plan with the patient. The patient was provided an opportunity to ask questions and all were answered. The patient agreed with the plan and demonstrated an understanding of the instructions.   The patient was advised to call back or seek an in-person evaluation if the symptoms worsen or if the condition fails to improve as anticipated.  I provided 20 minutes of non-face-to-face time during this encounter.   Levonne Spiller, MD  [facility] MD/PA/NP OP Progress Note  11/09/2022 10:56 AM [name]  MRN:  [id number]  Chief Complaint:  Chief Complaint  Patient presents with   Depression   ADD   Follow-up   HPI: This patient is a 37 year old divorced black female who lives with her 2 daughters in [place].  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  The patient returns for follow-up after 3 months.  Last time she was rather stressed as she was about to change jobs.  She was more anxious and sad.  She had gone back to the Zoloft 50 mg daily and this has helped considerably.  She denies significant depression sadness thoughts of self-harm or suicide.  She is focusing well with the Vyvanse and Adderall. Visit Diagnosis:    ICD-10-CM   1. Attention deficit hyperactivity disorder (ADHD), predominantly inattentive type  F90.0     2. Mild episode of recurrent major depressive disorder ([facility])  F33.0       Past Psychiatric History: none  Past Medical History:  Past Medical  History:  Diagnosis Date   Bell's palsy    Bell's palsy     Past Surgical History:  Procedure Laterality Date   BILATERAL SALPINGECTOMY  2013   Due to tubal pregnancy   CESAREAN SECTION  x 2   TONSILLECTOMY  1991   TUBAL LIGATION     WISDOM TOOTH EXTRACTION      Family Psychiatric History: See below  Family History:  Family History  Problem Relation Age of Onset   Depression Mother    Hypertension Mother    Asthma Mother    Hypertension Father    Cancer Maternal Grandmother        ovarian    Cancer Paternal Grandfather        lung    Asthma Brother    Heart disease Unknown    Arthritis Unknown    Lung disease Unknown    Cancer Unknown    Asthma Unknown    Anesthesia problems Neg Hx    Hypotension Neg Hx    Malignant hyperthermia Neg Hx    Pseudochol deficiency Neg Hx     Social History:  Social History   Socioeconomic History   Marital status: Divorced    Spouse name: Not on file   Number of children: Not on file   Years of education: college   Highest education level: Not on file  Occupational History   Occupation: lpn    Employer: [employer]  Tobacco Use   Smoking status: Former  Packs/day: 0.25    Years: 3.00    Total pack years: 0.75    Types: Cigarettes    Quit date: 02/09/2002    Years since quitting: 20.7   Smokeless tobacco: Never  Vaping Use   Vaping Use: Never used  Substance and Sexual Activity   Alcohol use: Yes    Comment: occasionally   Drug use: No   Sexual activity: Yes    Birth control/protection: Surgical  Other Topics Concern   Not on file  Social History Narrative   Not on file   Social Determinants of Health   Financial Resource Strain: Not on file  Food Insecurity: Not on file  Transportation Needs: Not on file  Physical Activity: Not on file  Stress: Not on file  Social Connections: Not on file    Allergies:  Allergies  Allergen Reactions   Percocet [Oxycodone-Acetaminophen] Itching   Wellbutrin  [Bupropion] Hives    Hallucinations     Metabolic Disorder Labs: No results found for: "HGBA1C", "MPG" No results found for: "PROLACTIN" Lab Results  Component Value Date   CHOL 144 04/19/2013   TRIG 58 04/19/2013   HDL 46 04/19/2013   CHOLHDL 3.1 04/19/2013   VLDL 12 04/19/2013   LDLCALC 86 04/19/2013   LDLCALC 64 04/26/2011   No results found for: "TSH"  Therapeutic Level Labs: No results found for: "LITHIUM" No results found for: "VALPROATE" No results found for: "CBMZ"  Current Medications: Current Outpatient Medications  Medication Sig Dispense Refill   amphetamine-dextroamphetamine (ADDERALL) 10 MG tablet Take at 3 pm 30 tablet 0   amphetamine-dextroamphetamine (ADDERALL) 10 MG tablet Take at 3 pm 30 tablet 0   amphetamine-dextroamphetamine (ADDERALL) 10 MG tablet Take at 3 pm 30 tablet 0   lisdexamfetamine (VYVANSE) 60 MG capsule Take 1 capsule (60 mg total) by mouth every morning. 30 capsule 0   lisdexamfetamine (VYVANSE) 60 MG capsule Take 1 capsule (60 mg total) by mouth every morning. 30 capsule 0   lisdexamfetamine (VYVANSE) 60 MG capsule Take 1 capsule (60 mg total) by mouth every morning. 30 capsule 0   sertraline (ZOLOFT) 50 MG tablet Take 1 tablet (50 mg total) by mouth daily. 90 tablet 2   No current facility-administered medications for this visit.     Musculoskeletal: Strength & Muscle Tone: within normal limits Gait & Station: normal Patient leans: N/A  Psychiatric Specialty Exam: Review of Systems  All other systems reviewed and are negative.   There were no vitals taken for this visit.There is no height or weight on file to calculate BMI.  General Appearance: Casual, Neat, and Well Groomed  Eye Contact:  Good  Speech:  Clear and Coherent  Volume:  Normal  Mood:  Euthymic  Affect:  Congruent  Thought Process:  Goal Directed  Orientation:  Full (Time, Place, and Person)  Thought Content: WDL   Suicidal Thoughts:  No  Homicidal Thoughts:   No  Memory:  Immediate;   Good Recent;   Good Remote;   Good  Judgement:  Good  Insight:  Good  Psychomotor Activity:  Normal  Concentration:  Concentration: Good and Attention Span: Good  Recall:  Good  Fund of Knowledge: Good  Language: Good  Akathisia:  No  Handed:  Right  AIMS (if indicated): not done  Assets:  Communication Skills Desire for Improvement Physical Health Resilience Social Support Talents/Skills Vocational/Educational  ADL's:  Intact  Cognition: WNL  Sleep:  Good   Screenings: PHQ2-9    Flowsheet  Row Video Visit from 08/05/2022 in [facility]  PHQ-2 Total Score 2  PHQ-9 Total Score 3      Flowsheet Row Video Visit from 08/05/2022 in [facility] No Risk        Assessment and Plan: This patient is a 37 year old female with a history of ADD and mild depression.  She is doing better on her current regimen.  She will continue Zoloft 50 mg daily for anxiety and depression as well as Vyvanse 60 mg in the morning and Adderall 10 mg at 3 PM for ADD.  She will return to see me in 3 months  Collaboration of Care: Collaboration of Care: Primary Care Provider AEB notes will be shared with PCP at patient's request  Patient/Guardian was advised Release of Information must be obtained prior to any record release in order to collaborate their care with an outside provider. Patient/Guardian was advised if they have not already done so to contact the registration department to sign all necessary forms in order for [place] to release information regarding their care.   Consent: Patient/Guardian gives verbal consent for treatment and assignment of benefits for services provided during this visit. Patient/Guardian expressed understanding and agreed to proceed.    Levonne Spiller, MD 11/09/2022, 10:56 AM

## 2022-11-21 ENCOUNTER — Other Ambulatory Visit (HOSPITAL_COMMUNITY): Payer: Self-pay

## 2022-12-06 ENCOUNTER — Other Ambulatory Visit (HOSPITAL_COMMUNITY): Payer: Self-pay

## 2022-12-06 ENCOUNTER — Other Ambulatory Visit: Payer: Self-pay

## 2023-01-25 ENCOUNTER — Other Ambulatory Visit (HOSPITAL_COMMUNITY): Payer: Self-pay

## 2023-01-26 ENCOUNTER — Other Ambulatory Visit (HOSPITAL_COMMUNITY): Payer: Self-pay

## 2023-03-22 ENCOUNTER — Other Ambulatory Visit (HOSPITAL_COMMUNITY): Payer: Self-pay

## 2023-05-10 ENCOUNTER — Other Ambulatory Visit (HOSPITAL_COMMUNITY): Payer: Self-pay

## 2023-05-10 DIAGNOSIS — Z1159 Encounter for screening for other viral diseases: Secondary | ICD-10-CM | POA: Diagnosis not present

## 2023-05-10 DIAGNOSIS — Z6832 Body mass index (BMI) 32.0-32.9, adult: Secondary | ICD-10-CM | POA: Diagnosis not present

## 2023-05-10 DIAGNOSIS — Z13220 Encounter for screening for lipoid disorders: Secondary | ICD-10-CM | POA: Diagnosis not present

## 2023-05-10 DIAGNOSIS — Z7689 Persons encountering health services in other specified circumstances: Secondary | ICD-10-CM | POA: Diagnosis not present

## 2023-05-10 DIAGNOSIS — Z1329 Encounter for screening for other suspected endocrine disorder: Secondary | ICD-10-CM | POA: Diagnosis not present

## 2023-05-10 DIAGNOSIS — Z01419 Encounter for gynecological examination (general) (routine) without abnormal findings: Secondary | ICD-10-CM | POA: Diagnosis not present

## 2023-05-10 MED ORDER — FLUCONAZOLE 150 MG PO TABS
150.0000 mg | ORAL_TABLET | ORAL | 1 refills | Status: AC
  Filled 2023-05-10: qty 1, 1d supply, fill #0

## 2023-05-18 ENCOUNTER — Other Ambulatory Visit (HOSPITAL_COMMUNITY): Payer: Self-pay

## 2023-10-17 ENCOUNTER — Other Ambulatory Visit (HOSPITAL_COMMUNITY): Payer: Self-pay

## 2023-10-17 ENCOUNTER — Other Ambulatory Visit (HOSPITAL_COMMUNITY): Payer: Self-pay | Admitting: Psychiatry

## 2023-10-17 ENCOUNTER — Encounter (HOSPITAL_COMMUNITY): Payer: Self-pay

## 2023-10-17 NOTE — Telephone Encounter (Signed)
Meds can be filled only if pt schedules appt

## 2024-03-28 ENCOUNTER — Other Ambulatory Visit (HOSPITAL_COMMUNITY): Payer: Self-pay

## 2024-03-28 ENCOUNTER — Telehealth (HOSPITAL_COMMUNITY): Admitting: Psychiatry

## 2024-03-28 ENCOUNTER — Encounter (HOSPITAL_COMMUNITY): Payer: Self-pay | Admitting: Psychiatry

## 2024-03-28 ENCOUNTER — Other Ambulatory Visit: Payer: Self-pay

## 2024-03-28 DIAGNOSIS — F900 Attention-deficit hyperactivity disorder, predominantly inattentive type: Secondary | ICD-10-CM | POA: Diagnosis not present

## 2024-03-28 MED ORDER — LISDEXAMFETAMINE DIMESYLATE 30 MG PO CAPS
30.0000 mg | ORAL_CAPSULE | ORAL | 0 refills | Status: DC
  Filled 2024-03-28 (×2): qty 30, 30d supply, fill #0

## 2024-03-28 NOTE — Progress Notes (Signed)
 Virtual Visit via Video Note  I connected with Cathye Coca on 03/28/24 at  9:40 AM EDT by a video enabled telemedicine application and verified that I am speaking with the correct person using two identifiers.  Location: Patient: home Provider: office   I discussed the limitations of evaluation and management by telemedicine and the availability of in person appointments. The patient expressed understanding and agreed to proceed.     I discussed the assessment and treatment plan with the patient. The patient was provided an opportunity to ask questions and all were answered. The patient agreed with the plan and demonstrated an understanding of the instructions.   The patient was advised to call back or seek an in-person evaluation if the symptoms worsen or if the condition fails to improve as anticipated.  I provided 20 minutes of non-face-to-face time during this encounter.   Alfredia Annas, MD  [facility] MD/PA/NP OP Progress Note  03/28/2024 9:45 AM Cathye Coca  MRN:  [id number]  Chief Complaint:  Chief Complaint  Patient presents with   ADD   Follow-up   HPI: : This patient is a 38 year old divorced black female who lives with her 2 daughters in [place].  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  She was last seen about 18 months ago regarding her ADHD and mild depression.  She states that she tried to do a.  Without any medications.  She denies any current depression even though her grandmother died recently.  However she is having a hard time staying focused during the day.  She is using reminders on her phone but it still is not enough.  She used to take Vyvanse  and Adderall and would like to restart at least the Vyvanse  and I think this is reasonable.  She used to be on 60 mg but this is a difficult dose to start with as it can cause stomach upset so she will start at 30 mg by her  request. Visit Diagnosis:    ICD-10-CM   1. Attention deficit hyperactivity disorder (ADHD), predominantly inattentive type  F90.0       Past Psychiatric History: Long term outpatient treatment in our office  Past Medical History:  Past Medical History:  Diagnosis Date   Bell's palsy    Bell's palsy     Past Surgical History:  Procedure Laterality Date   BILATERAL SALPINGECTOMY  2013   Due to tubal pregnancy   CESAREAN SECTION  x 2   TONSILLECTOMY  1991   TUBAL LIGATION     WISDOM TOOTH EXTRACTION      Family Psychiatric History: See below  Family History:  Family History  Problem Relation Age of Onset   Depression Mother    Hypertension Mother    Asthma Mother    Hypertension Father    Cancer Maternal Grandmother        ovarian    Cancer Paternal Grandfather        lung    Asthma Brother    Heart disease Unknown    Arthritis Unknown    Lung disease Unknown    Cancer Unknown    Asthma Unknown    Anesthesia problems Neg Hx    Hypotension Neg Hx    Malignant hyperthermia Neg Hx    Pseudochol deficiency Neg Hx     Social History:  Social History   Socioeconomic History  Marital status: Divorced    Spouse name: Not on file   Number of children: Not on file   Years of education: college   Highest education level: Not on file  Occupational History   Occupation: lpn    Employer: [employer]  Tobacco Use   Smoking status: Former    Current packs/day: 0.00    Average packs/day: 0.3 packs/day for 3.0 years (0.8 ttl pk-yrs)    Types: Cigarettes    Start date: 02/10/1999    Quit date: 02/09/2002    Years since quitting: 22.1   Smokeless tobacco: Never  Vaping Use   Vaping status: Never Used  Substance and Sexual Activity   Alcohol use: Yes    Comment: occasionally   Drug use: No   Sexual activity: Yes    Birth control/protection: Surgical  Other Topics Concern   Not on file  Social History Narrative   Not on file   Social Drivers of Health    Financial Resource Strain: Not on file  Food Insecurity: Not on file  Transportation Needs: Not on file  Physical Activity: Not on file  Stress: Not on file  Social Connections: Not on file    Allergies:  Allergies  Allergen Reactions   Percocet [Oxycodone-Acetaminophen ] Itching   Wellbutrin  [Bupropion ] Hives    Hallucinations     Metabolic Disorder Labs: No results found for: "HGBA1C", "MPG" No results found for: "PROLACTIN" Lab Results  Component Value Date   CHOL 144 04/19/2013   TRIG 58 04/19/2013   HDL 46 04/19/2013   CHOLHDL 3.1 04/19/2013   VLDL 12 04/19/2013   LDLCALC 86 04/19/2013   LDLCALC 64 04/26/2011   No results found for: "TSH"  Therapeutic Level Labs: No results found for: "LITHIUM" No results found for: "VALPROATE" No results found for: "CBMZ"  Current Medications: Current Outpatient Medications  Medication Sig Dispense Refill   lisdexamfetamine (VYVANSE ) 30 MG capsule Take 1 capsule (30 mg total) by mouth every morning. 30 capsule 0   fluconazole  (DIFLUCAN ) 150 MG tablet Take 1 tablet (150 mg total) by mouth as a single dose 1 tablet 1   No current facility-administered medications for this visit.     Musculoskeletal: Strength & Muscle Tone: within normal limits Gait & Station: normal Patient leans: N/A  Psychiatric Specialty Exam: Review of Systems  Psychiatric/Behavioral:  Positive for decreased concentration.   All other systems reviewed and are negative.   There were no vitals taken for this visit.There is no height or weight on file to calculate BMI.  General Appearance: Casual and Fairly Groomed  Eye Contact:  Good  Speech:  Clear and Coherent  Volume:  Normal  Mood:  Euthymic  Affect:  Congruent  Thought Process:  Goal Directed  Orientation:  Full (Time, Place, and Person)  Thought Content: WDL   Suicidal Thoughts:  No  Homicidal Thoughts:  No  Memory:  Immediate;   Good Recent;   Good Remote;   Good  Judgement:  Good   Insight:  Good  Psychomotor Activity:  Normal  Concentration:  Concentration: Poor and Attention Span: Poor  Recall:  Good  Fund of Knowledge: Good  Language: Good  Akathisia:  No  Handed:  Right  AIMS (if indicated): not done  Assets:  Communication Skills Desire for Improvement Physical Health Resilience Social Support Talents/Skills Vocational/Educational  ADL's:  Intact  Cognition: WNL  Sleep:  Good   Screenings: PHQ2-9    Flowsheet Row Video Visit from 08/05/2022 in [facility] Total Score 2  PHQ-9 Total Score 3      Flowsheet Row Video Visit from 08/05/2022 in [facility] [place]  C-SSRS RISK CATEGORY No Risk        Assessment and Plan: This patient is a 38 year old female with a history of ADD and mild depression.  She denies depression now but is having trouble with focus she would like to resume Vyvanse  so we will start with 30 mg in the morning for ADD.  She will return to see me in 4 weeks  Collaboration of Care: Collaboration of Care: Primary Care Provider AEB notes will be shared with PCP at patient's request  Patient/Guardian was advised Release of Information must be obtained prior to any record release in order to collaborate their care with an outside provider. Patient/Guardian was advised if they have not already done so to contact the registration department to sign all necessary forms in order for us  to release information regarding their care.   Consent: Patient/Guardian gives verbal consent for treatment and assignment of benefits for services provided during this visit. Patient/Guardian expressed understanding and agreed to proceed.    Alfredia Annas, MD 03/28/2024, 9:45 AM

## 2024-03-29 ENCOUNTER — Other Ambulatory Visit (HOSPITAL_COMMUNITY): Payer: Self-pay

## 2024-04-29 ENCOUNTER — Other Ambulatory Visit: Payer: Self-pay

## 2024-04-29 ENCOUNTER — Other Ambulatory Visit (HOSPITAL_COMMUNITY): Payer: Self-pay

## 2024-04-29 ENCOUNTER — Encounter (HOSPITAL_COMMUNITY): Payer: Self-pay | Admitting: Psychiatry

## 2024-04-29 ENCOUNTER — Telehealth (INDEPENDENT_AMBULATORY_CARE_PROVIDER_SITE_OTHER): Admitting: Psychiatry

## 2024-04-29 DIAGNOSIS — F900 Attention-deficit hyperactivity disorder, predominantly inattentive type: Secondary | ICD-10-CM

## 2024-04-29 MED ORDER — LISDEXAMFETAMINE DIMESYLATE 50 MG PO CAPS
50.0000 mg | ORAL_CAPSULE | ORAL | 0 refills | Status: DC
  Filled 2024-04-29: qty 30, 30d supply, fill #0

## 2024-04-29 MED ORDER — LISDEXAMFETAMINE DIMESYLATE 50 MG PO CAPS
50.0000 mg | ORAL_CAPSULE | ORAL | 0 refills | Status: DC
  Filled 2024-04-29 – 2024-06-28 (×3): qty 30, 30d supply, fill #0

## 2024-04-29 MED ORDER — LISDEXAMFETAMINE DIMESYLATE 50 MG PO CAPS
50.0000 mg | ORAL_CAPSULE | ORAL | 0 refills | Status: DC
  Filled 2024-04-29 – 2024-05-29 (×2): qty 30, 30d supply, fill #0

## 2024-04-29 NOTE — Progress Notes (Signed)
 Virtual Visit via Video Note  I connected with [name] on 04/29/24 at  9:00 AM EDT by a video enabled telemedicine application and verified that I am speaking with the correct person using two identifiers.  Location: Patient: home Provider: office   I discussed the limitations of evaluation and management by telemedicine and the availability of in person appointments. The patient expressed understanding and agreed to proceed.     I discussed the assessment and treatment plan with the patient. The patient was provided an opportunity to ask questions and all were answered. The patient agreed with the plan and demonstrated an understanding of the instructions.   The patient was advised to call back or seek an in-person evaluation if the symptoms worsen or if the condition fails to improve as anticipated.  I provided 20 minutes of non-face-to-face time during this encounter.   Melissa Gull, MD  [facility] MD/PA/NP OP Progress Note  04/29/2024 9:02 AM [name]  MRN:  [id number]  Chief Complaint:  Chief Complaint  Patient presents with   ADHD   Follow-up   HPI: This patient is a 38 year old divorced black female who lives with her 2 daughters in [place].  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  Last time we restarted Vyvanse  30 mg every morning.  This is working to some degree but wearing off by noon.  She initially had the side effect of dizziness which is since subsided.  She is sleeping and eating fairly well.  She would like to increase the dose as she has been on higher doses in the past.  We settled on a dosage of 50 mg. Visit Diagnosis:    ICD-10-CM   1. Attention deficit hyperactivity disorder (ADHD), predominantly inattentive type  F90.0       Past Psychiatric History: Long-term outpatient treatment in our office  Past Medical History:  Past  Medical History:  Diagnosis Date   Bell's palsy    Bell's palsy     Past Surgical History:  Procedure Laterality Date   BILATERAL SALPINGECTOMY  2013   Due to tubal pregnancy   CESAREAN SECTION  x 2   TONSILLECTOMY  1991   TUBAL LIGATION     WISDOM TOOTH EXTRACTION      Family Psychiatric History: See below  Family History:  Family History  Problem Relation Age of Onset   Depression Mother    Hypertension Mother    Asthma Mother    Hypertension Father    Cancer Maternal Grandmother        ovarian    Cancer Paternal Grandfather        lung    Asthma Brother    Heart disease Unknown    Arthritis Unknown    Lung disease Unknown    Cancer Unknown    Asthma Unknown    Anesthesia problems Neg Hx    Hypotension Neg Hx    Malignant hyperthermia Neg Hx    Pseudochol deficiency Neg Hx     Social History:  Social History   Socioeconomic History   Marital status: Divorced    Spouse name: Not on file   Number of children: Not on file   Years of education: college   Highest education level: Not on file  Occupational History   Occupation: lpn    Employer: [employer]  Tobacco Use  Smoking status: Former    Current packs/day: 0.00    Average packs/day: 0.3 packs/day for 3.0 years (0.8 ttl pk-yrs)    Types: Cigarettes    Start date: 02/10/1999    Quit date: 02/09/2002    Years since quitting: 22.2   Smokeless tobacco: Never  Vaping Use   Vaping status: Never Used  Substance and Sexual Activity   Alcohol use: Yes    Comment: occasionally   Drug use: No   Sexual activity: Yes    Birth control/protection: Surgical  Other Topics Concern   Not on file  Social History Narrative   Not on file   Social Drivers of Health   Financial Resource Strain: Not on file  Food Insecurity: Not on file  Transportation Needs: Not on file  Physical Activity: Not on file  Stress: Not on file  Social Connections: Not on file    Allergies:  Allergies  Allergen Reactions    Percocet [Oxycodone-Acetaminophen ] Itching   Wellbutrin  [Bupropion ] Hives    Hallucinations     Metabolic Disorder Labs: No results found for: HGBA1C, MPG No results found for: PROLACTIN Lab Results  Component Value Date   CHOL 144 04/19/2013   TRIG 58 04/19/2013   HDL 46 04/19/2013   CHOLHDL 3.1 04/19/2013   VLDL 12 04/19/2013   LDLCALC 86 04/19/2013   LDLCALC 64 04/26/2011   No results found for: TSH  Therapeutic Level Labs: No results found for: LITHIUM No results found for: VALPROATE No results found for: CBMZ  Current Medications: Current Outpatient Medications  Medication Sig Dispense Refill   lisdexamfetamine (VYVANSE ) 50 MG capsule Take 1 capsule (50 mg total) by mouth every morning. 30 capsule 0   lisdexamfetamine (VYVANSE ) 50 MG capsule Take 1 capsule (50 mg total) by mouth every morning. 30 capsule 0   lisdexamfetamine (VYVANSE ) 50 MG capsule Take 1 capsule (50 mg total) by mouth every morning. 30 capsule 0   fluconazole  (DIFLUCAN ) 150 MG tablet Take 1 tablet (150 mg total) by mouth as a single dose 1 tablet 1   No current facility-administered medications for this visit.     Musculoskeletal: Strength & Muscle Tone: within normal limits Gait & Station: normal Patient leans: N/A  Psychiatric Specialty Exam: Review of Systems  Psychiatric/Behavioral:  Positive for decreased concentration.   All other systems reviewed and are negative.   There were no vitals taken for this visit.There is no height or weight on file to calculate BMI.  General Appearance: Casual and Fairly Groomed  Eye Contact:  Good  Speech:  Clear and Coherent  Volume:  Normal  Mood:  Euthymic  Affect:  Congruent  Thought Process:  Goal Directed  Orientation:  Full (Time, Place, and Person)  Thought Content: WDL   Suicidal Thoughts:  No  Homicidal Thoughts:  No  Memory:  Immediate;   Good Recent;   Good Remote;   Good  Judgement:  Good  Insight:  Good  Psychomotor  Activity:  Normal  Concentration:  Concentration: Fair and Attention Span: Fair  Recall:  Good  Fund of Knowledge: Good  Language: Good  Akathisia:  No  Handed:  Right  AIMS (if indicated): not done  Assets:  Communication Skills Desire for Improvement Physical Health Resilience Social Support Talents/Skills Vocational/Educational  ADL's:  Intact  Cognition: WNL  Sleep:  Good   Screenings: PHQ2-9    Flowsheet Row Video Visit from 08/05/2022 in [facility] Total Score 2  PHQ-9 Total Score 3   Flowsheet Row Video Visit from 08/05/2022 in [facility] [place]  C-SSRS RISK CATEGORY No Risk     Assessment and Plan:  This patient is a 38 year old female with a history of ADD.  The Vyvanse  30 mg is not lasting through her day so it will be increased to 50 mg every morning.  She will return to see me in 3 months Collaboration of Care: Collaboration of Care: Primary Care Provider AEB notes will be shared with PCP at patient's request  Patient/Guardian was advised Release of Information must be obtained prior to any record release in order to collaborate their care with an outside provider. Patient/Guardian was advised if they have not already done so to contact the registration department to sign all necessary forms in order for us  to release information regarding their care.   Consent: Patient/Guardian gives verbal consent for treatment and assignment of benefits for services provided during this visit. Patient/Guardian expressed understanding and agreed to proceed.    Melissa Gull, MD 04/29/2024, 9:02 AM

## 2024-05-29 ENCOUNTER — Other Ambulatory Visit: Payer: Self-pay

## 2024-05-29 ENCOUNTER — Other Ambulatory Visit (HOSPITAL_COMMUNITY): Payer: Self-pay

## 2024-06-28 ENCOUNTER — Other Ambulatory Visit (HOSPITAL_COMMUNITY): Payer: Self-pay

## 2024-06-28 ENCOUNTER — Other Ambulatory Visit: Payer: Self-pay

## 2024-07-13 ENCOUNTER — Telehealth: Admitting: Nurse Practitioner

## 2024-07-13 ENCOUNTER — Other Ambulatory Visit: Payer: Self-pay | Admitting: Nurse Practitioner

## 2024-07-13 DIAGNOSIS — R399 Unspecified symptoms and signs involving the genitourinary system: Secondary | ICD-10-CM

## 2024-07-13 MED ORDER — NITROFURANTOIN MONOHYD MACRO 100 MG PO CAPS
100.0000 mg | ORAL_CAPSULE | Freq: Two times a day (BID) | ORAL | 0 refills | Status: DC
  Filled 2024-07-13: qty 10, 5d supply, fill #0

## 2024-07-13 MED ORDER — NITROFURANTOIN MONOHYD MACRO 100 MG PO CAPS: 100.0000 mg | ORAL_CAPSULE | Freq: Two times a day (BID) | ORAL | 0 refills | Status: AC

## 2024-07-13 NOTE — Progress Notes (Signed)

## 2024-07-15 ENCOUNTER — Other Ambulatory Visit: Payer: Self-pay

## 2024-07-29 ENCOUNTER — Other Ambulatory Visit: Payer: Self-pay

## 2024-07-29 ENCOUNTER — Other Ambulatory Visit (HOSPITAL_COMMUNITY): Payer: Self-pay

## 2024-07-29 ENCOUNTER — Other Ambulatory Visit (HOSPITAL_COMMUNITY): Payer: Self-pay | Admitting: Psychiatry

## 2024-07-29 ENCOUNTER — Encounter (HOSPITAL_COMMUNITY): Payer: Self-pay | Admitting: Psychiatry

## 2024-07-29 ENCOUNTER — Telehealth (INDEPENDENT_AMBULATORY_CARE_PROVIDER_SITE_OTHER): Admitting: Psychiatry

## 2024-07-29 DIAGNOSIS — F900 Attention-deficit hyperactivity disorder, predominantly inattentive type: Secondary | ICD-10-CM

## 2024-07-29 MED ORDER — LISDEXAMFETAMINE DIMESYLATE 50 MG PO CAPS
50.0000 mg | ORAL_CAPSULE | ORAL | 0 refills | Status: AC
  Filled 2024-07-29 – 2024-08-26 (×2): qty 30, 30d supply, fill #0

## 2024-07-29 MED ORDER — LISDEXAMFETAMINE DIMESYLATE 50 MG PO CAPS
50.0000 mg | ORAL_CAPSULE | ORAL | 0 refills | Status: AC
  Filled 2024-07-29 – 2024-09-24 (×2): qty 30, 30d supply, fill #0

## 2024-07-29 MED ORDER — LISDEXAMFETAMINE DIMESYLATE 50 MG PO CAPS
50.0000 mg | ORAL_CAPSULE | ORAL | 0 refills | Status: DC
  Filled 2024-07-29: qty 30, 30d supply, fill #0

## 2024-07-29 NOTE — Progress Notes (Signed)
 Virtual Visit via Video Note  I connected with [name] on 07/29/24 at  8:40 AM EDT by a video enabled telemedicine application and verified that I am speaking with the correct person using two identifiers.  Location: Patient: home Provider: office   I discussed the limitations of evaluation and management by telemedicine and the availability of in person appointments. The patient expressed understanding and agreed to proceed.     I discussed the assessment and treatment plan with the patient. The patient was provided an opportunity to ask questions and all were answered. The patient agreed with the plan and demonstrated an understanding of the instructions.   The patient was advised to call back or seek an in-person evaluation if the symptoms worsen or if the condition fails to improve as anticipated.  I provided 20 minutes of non-face-to-face time during this encounter.   [name], MD  [facility] MD/PA/NP OP Progress Note  07/29/2024 8:48 AM [name]  MRN:  [id number]  Chief Complaint:  Chief Complaint  Patient presents with   ADD   Follow-up   HPI: This patient is a 38 year old divorced black female who lives with her 2 daughters in [place].  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  The patient returns for follow-up after 3 months regarding her ADD inattentive type.  She remains on Vyvanse  50 mg every morning.  She states that this is keeping her focus for her job.  She is not having any side effects and generally sleeps and eats okay.  She denies any change in mood. Visit Diagnosis:    ICD-10-CM   1. Attention deficit hyperactivity disorder (ADHD), predominantly inattentive type  F90.0       Past Psychiatric History: Long-term outpatient treatment in our office  Past Medical History:  Past Medical History:  Diagnosis Date   Bell's palsy    Bell's palsy     Past Surgical History:  Procedure Laterality Date   BILATERAL  SALPINGECTOMY  2013   Due to tubal pregnancy   CESAREAN SECTION  x 2   TONSILLECTOMY  1991   TUBAL LIGATION     WISDOM TOOTH EXTRACTION      Family Psychiatric History: See below  Family History:  Family History  Problem Relation Age of Onset   Depression Mother    Hypertension Mother    Asthma Mother    Hypertension Father    Cancer Maternal Grandmother        ovarian    Cancer Paternal Grandfather        lung    Asthma Brother    Heart disease Unknown    Arthritis Unknown    Lung disease Unknown    Cancer Unknown    Asthma Unknown    Anesthesia problems Neg Hx    Hypotension Neg Hx    Malignant hyperthermia Neg Hx    Pseudochol deficiency Neg Hx     Social History:  Social History   Socioeconomic History   Marital status: Divorced    Spouse name: Not on file   Number of children: Not on file   Years of education: college   Highest education level: Not on file  Occupational History   Occupation: lpn    Employer: [employer]  Tobacco Use   Smoking status: Former    Current packs/day: 0.00    Average packs/day: 0.3 packs/day for 3.0 years (0.8 ttl pk-yrs)    Types: Cigarettes  Start date: 02/10/1999    Quit date: 02/09/2002    Years since quitting: 22.4   Smokeless tobacco: Never  Vaping Use   Vaping status: Never Used  Substance and Sexual Activity   Alcohol use: Yes    Comment: occasionally   Drug use: No   Sexual activity: Yes    Birth control/protection: Surgical  Other Topics Concern   Not on file  Social History Narrative   Not on file   Social Drivers of Health   Financial Resource Strain: Not on file  Food Insecurity: Not on file  Transportation Needs: Not on file  Physical Activity: Not on file  Stress: Not on file  Social Connections: Not on file    Allergies:  Allergies  Allergen Reactions   Percocet [Oxycodone-Acetaminophen ] Itching   Wellbutrin  [Bupropion ] Hives    Hallucinations     Metabolic Disorder Labs: No results  found for: HGBA1C, MPG No results found for: PROLACTIN Lab Results  Component Value Date   CHOL 144 04/19/2013   TRIG 58 04/19/2013   HDL 46 04/19/2013   CHOLHDL 3.1 04/19/2013   VLDL 12 04/19/2013   LDLCALC 86 04/19/2013   LDLCALC 64 04/26/2011   No results found for: TSH  Therapeutic Level Labs: No results found for: LITHIUM No results found for: VALPROATE No results found for: CBMZ  Current Medications: Current Outpatient Medications  Medication Sig Dispense Refill   fluconazole  (DIFLUCAN ) 150 MG tablet Take 1 tablet (150 mg total) by mouth as a single dose 1 tablet 1   lisdexamfetamine (VYVANSE ) 50 MG capsule Take 1 capsule (50 mg total) by mouth every morning. 30 capsule 0   lisdexamfetamine (VYVANSE ) 50 MG capsule Take 1 capsule (50 mg total) by mouth every morning. 30 capsule 0   lisdexamfetamine (VYVANSE ) 50 MG capsule Take 1 capsule (50 mg total) by mouth every morning. 30 capsule 0   No current facility-administered medications for this visit.     Musculoskeletal: Strength & Muscle Tone: within normal limits Gait & Station: normal Patient leans: N/A  Psychiatric Specialty Exam: Review of Systems  All other systems reviewed and are negative.   There were no vitals taken for this visit.There is no height or weight on file to calculate BMI.  General Appearance: Casual and Fairly Groomed  Eye Contact:  Good  Speech:  Clear and Coherent  Volume:  Normal  Mood:  Euthymic  Affect:  Congruent  Thought Process:  Goal Directed  Orientation:  Full (Time, Place, and Person)  Thought Content: WDL   Suicidal Thoughts:  No  Homicidal Thoughts:  No  Memory:  Immediate;   Good Recent;   Good Remote;   NA  Judgement:  Good  Insight:  Good  Psychomotor Activity:  Normal  Concentration:  Concentration: Good and Attention Span: Good  Recall:  Good  Fund of Knowledge: Good  Language: Good  Akathisia:  No  Handed:  Right  AIMS (if indicated): not done   Assets:  Communication Skills Desire for Improvement Physical Health Resilience Social Support Talents/Skills  ADL's:  Intact  Cognition: WNL  Sleep:  Good   Screenings: PHQ2-9    Flowsheet Row Video Visit from 08/05/2022 in [facility] Total Score 2  PHQ-9 Total Score 3   Flowsheet Row Video Visit from 08/05/2022 in [facility] [place]  C-SSRS RISK CATEGORY No Risk     Assessment and Plan: This patient is a 38 year old  female with a history of ADD.  She is doing well with Vyvanse  50 mg every morning for the ADD inattentive type.  She will return to see me in 6 months but call at the 90-day mark for refills.  Collaboration of Care: Collaboration of Care: Primary Care Provider AEB notes to be shared with PCP at patient's request  Patient/Guardian was advised Release of Information must be obtained prior to any record release in order to collaborate their care with an outside provider. Patient/Guardian was advised if they have not already done so to contact the registration department to sign all necessary forms in order for us  to release information regarding their care.   Consent: Patient/Guardian gives verbal consent for treatment and assignment of benefits for services provided during this visit. Patient/Guardian expressed understanding and agreed to proceed.    [name], MD 07/29/2024, 8:48 AM

## 2024-08-26 ENCOUNTER — Other Ambulatory Visit: Payer: Self-pay

## 2024-08-26 ENCOUNTER — Other Ambulatory Visit (HOSPITAL_COMMUNITY): Payer: Self-pay

## 2024-08-27 ENCOUNTER — Other Ambulatory Visit (HOSPITAL_COMMUNITY): Payer: Self-pay

## 2024-08-27 ENCOUNTER — Other Ambulatory Visit: Payer: Self-pay

## 2024-09-24 ENCOUNTER — Other Ambulatory Visit (HOSPITAL_COMMUNITY): Payer: Self-pay

## 2024-09-24 ENCOUNTER — Other Ambulatory Visit: Payer: Self-pay

## 2024-10-28 ENCOUNTER — Other Ambulatory Visit (HOSPITAL_COMMUNITY): Payer: Self-pay | Admitting: Psychiatry

## 2024-10-28 ENCOUNTER — Other Ambulatory Visit (HOSPITAL_COMMUNITY): Payer: Self-pay

## 2024-10-29 ENCOUNTER — Telehealth (HOSPITAL_COMMUNITY): Payer: Self-pay | Admitting: *Deleted

## 2024-10-29 NOTE — Telephone Encounter (Signed)
 Dr. Okey patient.SABRASABRAPatient called requesting refills for her Vyvanse  50mg  to be sent to [facility].

## 2024-10-30 ENCOUNTER — Other Ambulatory Visit: Payer: Self-pay

## 2024-10-30 ENCOUNTER — Other Ambulatory Visit (HOSPITAL_COMMUNITY): Payer: Self-pay | Admitting: Registered Nurse

## 2024-10-30 ENCOUNTER — Other Ambulatory Visit (HOSPITAL_COMMUNITY): Payer: Self-pay

## 2024-10-30 MED ORDER — LISDEXAMFETAMINE DIMESYLATE 50 MG PO CAPS
50.0000 mg | ORAL_CAPSULE | ORAL | 0 refills | Status: AC
  Filled 2024-10-30 – 2024-11-04 (×2): qty 30, 30d supply, fill #0

## 2024-10-30 NOTE — Telephone Encounter (Signed)
"  Refill sent  "

## 2024-11-01 ENCOUNTER — Other Ambulatory Visit (HOSPITAL_COMMUNITY): Payer: Self-pay

## 2024-11-01 ENCOUNTER — Other Ambulatory Visit (HOSPITAL_BASED_OUTPATIENT_CLINIC_OR_DEPARTMENT_OTHER): Payer: Self-pay

## 2024-11-01 ENCOUNTER — Encounter: Payer: Self-pay | Admitting: Pharmacist

## 2024-11-01 ENCOUNTER — Other Ambulatory Visit: Payer: Self-pay

## 2024-11-04 ENCOUNTER — Other Ambulatory Visit: Payer: Self-pay

## 2024-11-04 ENCOUNTER — Other Ambulatory Visit (HOSPITAL_COMMUNITY): Payer: Self-pay

## 2024-12-06 ENCOUNTER — Other Ambulatory Visit (HOSPITAL_COMMUNITY): Payer: Self-pay

## 2024-12-06 ENCOUNTER — Other Ambulatory Visit (HOSPITAL_COMMUNITY): Payer: Self-pay | Admitting: Psychiatry
